# Patient Record
Sex: Female | Born: 1955
Health system: Southern US, Community
[De-identification: ages and names within clinical notes are randomized; demographics above are authoritative.]

## PROBLEM LIST (undated history)

## (undated) DIAGNOSIS — M810 Age-related osteoporosis without current pathological fracture: Secondary | ICD-10-CM

## (undated) DIAGNOSIS — N6019 Diffuse cystic mastopathy of unspecified breast: Secondary | ICD-10-CM

## (undated) DIAGNOSIS — K5790 Diverticulosis of intestine, part unspecified, without perforation or abscess without bleeding: Secondary | ICD-10-CM

## (undated) DIAGNOSIS — M8580 Other specified disorders of bone density and structure, unspecified site: Secondary | ICD-10-CM

## (undated) DIAGNOSIS — A6000 Herpesviral infection of urogenital system, unspecified: Secondary | ICD-10-CM

## (undated) HISTORY — PX: BREAST EXCISIONAL BIOPSY: SUR124

## (undated) HISTORY — PX: COLONOSCOPY: SHX174

## (undated) HISTORY — PX: OTHER SURGICAL HISTORY: SHX169

---

## 2004-04-07 ENCOUNTER — Ambulatory Visit: Payer: Self-pay | Admitting: Obstetrics and Gynecology

## 2005-04-26 ENCOUNTER — Ambulatory Visit: Payer: Self-pay | Admitting: Obstetrics and Gynecology

## 2006-05-13 ENCOUNTER — Ambulatory Visit: Payer: Self-pay | Admitting: Obstetrics and Gynecology

## 2007-05-15 ENCOUNTER — Ambulatory Visit: Payer: Self-pay | Admitting: Obstetrics and Gynecology

## 2007-05-22 ENCOUNTER — Ambulatory Visit: Payer: Self-pay | Admitting: Obstetrics and Gynecology

## 2007-06-20 ENCOUNTER — Ambulatory Visit: Payer: Self-pay | Admitting: Gastroenterology

## 2008-05-24 ENCOUNTER — Ambulatory Visit: Payer: Self-pay | Admitting: Obstetrics and Gynecology

## 2009-05-25 ENCOUNTER — Ambulatory Visit: Payer: Self-pay | Admitting: Obstetrics and Gynecology

## 2010-05-31 ENCOUNTER — Ambulatory Visit: Payer: Self-pay | Admitting: Obstetrics and Gynecology

## 2011-06-05 ENCOUNTER — Ambulatory Visit: Payer: Self-pay | Admitting: Obstetrics and Gynecology

## 2012-07-14 ENCOUNTER — Ambulatory Visit: Payer: Self-pay | Admitting: Obstetrics and Gynecology

## 2013-08-03 ENCOUNTER — Ambulatory Visit: Payer: Self-pay | Admitting: Obstetrics and Gynecology

## 2014-08-16 ENCOUNTER — Other Ambulatory Visit: Payer: Self-pay | Admitting: Obstetrics and Gynecology

## 2014-08-16 DIAGNOSIS — Z1231 Encounter for screening mammogram for malignant neoplasm of breast: Secondary | ICD-10-CM

## 2014-08-19 ENCOUNTER — Ambulatory Visit
Admission: RE | Admit: 2014-08-19 | Discharge: 2014-08-19 | Disposition: A | Discharge status: Home / Self Care | Payer: 59 | Source: Ambulatory Visit | Attending: Obstetrics and Gynecology | Admitting: Obstetrics and Gynecology

## 2014-08-19 DIAGNOSIS — Z1231 Encounter for screening mammogram for malignant neoplasm of breast: Secondary | ICD-10-CM

## 2015-08-23 ENCOUNTER — Other Ambulatory Visit: Payer: Self-pay | Admitting: Obstetrics and Gynecology

## 2015-08-23 DIAGNOSIS — Z1231 Encounter for screening mammogram for malignant neoplasm of breast: Secondary | ICD-10-CM

## 2015-09-06 ENCOUNTER — Ambulatory Visit
Admission: RE | Admit: 2015-09-06 | Discharge: 2015-09-06 | Disposition: A | Discharge status: Home / Self Care | Payer: 59 | Source: Ambulatory Visit | Attending: Obstetrics and Gynecology | Admitting: Obstetrics and Gynecology

## 2015-09-06 ENCOUNTER — Other Ambulatory Visit: Payer: Self-pay | Admitting: Obstetrics and Gynecology

## 2015-09-06 DIAGNOSIS — Z1231 Encounter for screening mammogram for malignant neoplasm of breast: Secondary | ICD-10-CM | POA: Diagnosis not present

## 2016-03-22 DIAGNOSIS — Z0000 Encounter for general adult medical examination without abnormal findings: Secondary | ICD-10-CM | POA: Diagnosis not present

## 2016-06-05 DIAGNOSIS — D229 Melanocytic nevi, unspecified: Secondary | ICD-10-CM | POA: Diagnosis not present

## 2016-06-05 DIAGNOSIS — Z1283 Encounter for screening for malignant neoplasm of skin: Secondary | ICD-10-CM | POA: Diagnosis not present

## 2016-06-05 DIAGNOSIS — L720 Epidermal cyst: Secondary | ICD-10-CM | POA: Diagnosis not present

## 2016-09-18 ENCOUNTER — Other Ambulatory Visit: Payer: Self-pay | Admitting: Obstetrics and Gynecology

## 2016-09-18 DIAGNOSIS — Z1231 Encounter for screening mammogram for malignant neoplasm of breast: Secondary | ICD-10-CM | POA: Diagnosis not present

## 2016-09-18 DIAGNOSIS — Z01419 Encounter for gynecological examination (general) (routine) without abnormal findings: Secondary | ICD-10-CM | POA: Diagnosis not present

## 2016-09-18 DIAGNOSIS — Z124 Encounter for screening for malignant neoplasm of cervix: Secondary | ICD-10-CM | POA: Diagnosis not present

## 2016-09-18 DIAGNOSIS — Z1211 Encounter for screening for malignant neoplasm of colon: Secondary | ICD-10-CM | POA: Diagnosis not present

## 2016-09-27 DIAGNOSIS — Z01419 Encounter for gynecological examination (general) (routine) without abnormal findings: Secondary | ICD-10-CM | POA: Diagnosis not present

## 2016-10-01 ENCOUNTER — Ambulatory Visit
Admission: RE | Admit: 2016-10-01 | Discharge: 2016-10-01 | Disposition: A | Discharge status: Home / Self Care | Payer: 59 | Source: Ambulatory Visit | Attending: Obstetrics and Gynecology | Admitting: Obstetrics and Gynecology

## 2016-10-01 DIAGNOSIS — Z1231 Encounter for screening mammogram for malignant neoplasm of breast: Secondary | ICD-10-CM | POA: Diagnosis present

## 2016-11-20 DIAGNOSIS — Z23 Encounter for immunization: Secondary | ICD-10-CM | POA: Diagnosis not present

## 2017-05-13 DIAGNOSIS — Z0000 Encounter for general adult medical examination without abnormal findings: Secondary | ICD-10-CM | POA: Diagnosis not present

## 2017-06-04 DIAGNOSIS — Z808 Family history of malignant neoplasm of other organs or systems: Secondary | ICD-10-CM | POA: Diagnosis not present

## 2017-06-04 DIAGNOSIS — D2239 Melanocytic nevi of other parts of face: Secondary | ICD-10-CM | POA: Diagnosis not present

## 2017-06-04 DIAGNOSIS — Z1283 Encounter for screening for malignant neoplasm of skin: Secondary | ICD-10-CM | POA: Diagnosis not present

## 2017-07-09 DIAGNOSIS — H43812 Vitreous degeneration, left eye: Secondary | ICD-10-CM | POA: Diagnosis not present

## 2017-08-01 DIAGNOSIS — H43812 Vitreous degeneration, left eye: Secondary | ICD-10-CM | POA: Diagnosis not present

## 2017-08-12 DIAGNOSIS — R310 Gross hematuria: Secondary | ICD-10-CM | POA: Diagnosis not present

## 2017-08-12 DIAGNOSIS — R300 Dysuria: Secondary | ICD-10-CM | POA: Diagnosis not present

## 2017-09-24 ENCOUNTER — Other Ambulatory Visit: Payer: Self-pay | Admitting: Obstetrics and Gynecology

## 2017-09-24 DIAGNOSIS — Z1231 Encounter for screening mammogram for malignant neoplasm of breast: Secondary | ICD-10-CM

## 2017-09-24 DIAGNOSIS — Z1211 Encounter for screening for malignant neoplasm of colon: Secondary | ICD-10-CM | POA: Diagnosis not present

## 2017-09-24 DIAGNOSIS — Z01419 Encounter for gynecological examination (general) (routine) without abnormal findings: Secondary | ICD-10-CM | POA: Diagnosis not present

## 2017-09-24 DIAGNOSIS — Z1239 Encounter for other screening for malignant neoplasm of breast: Secondary | ICD-10-CM | POA: Diagnosis not present

## 2017-09-25 DIAGNOSIS — Z8639 Personal history of other endocrine, nutritional and metabolic disease: Secondary | ICD-10-CM | POA: Diagnosis not present

## 2017-09-25 DIAGNOSIS — Z01419 Encounter for gynecological examination (general) (routine) without abnormal findings: Secondary | ICD-10-CM | POA: Diagnosis not present

## 2017-10-08 DIAGNOSIS — M810 Age-related osteoporosis without current pathological fracture: Secondary | ICD-10-CM | POA: Diagnosis not present

## 2017-10-15 ENCOUNTER — Ambulatory Visit
Admission: RE | Admit: 2017-10-15 | Discharge: 2017-10-15 | Disposition: A | Discharge status: Home / Self Care | Payer: 59 | Source: Ambulatory Visit | Attending: Obstetrics and Gynecology | Admitting: Obstetrics and Gynecology

## 2017-10-15 DIAGNOSIS — Z1231 Encounter for screening mammogram for malignant neoplasm of breast: Secondary | ICD-10-CM | POA: Diagnosis present

## 2017-10-16 DIAGNOSIS — Z1211 Encounter for screening for malignant neoplasm of colon: Secondary | ICD-10-CM | POA: Diagnosis not present

## 2017-11-05 DIAGNOSIS — Z01818 Encounter for other preprocedural examination: Secondary | ICD-10-CM | POA: Diagnosis not present

## 2017-11-05 DIAGNOSIS — Z1211 Encounter for screening for malignant neoplasm of colon: Secondary | ICD-10-CM | POA: Diagnosis not present

## 2017-11-26 DIAGNOSIS — H43812 Vitreous degeneration, left eye: Secondary | ICD-10-CM | POA: Diagnosis not present

## 2018-01-01 DIAGNOSIS — K5730 Diverticulosis of large intestine without perforation or abscess without bleeding: Secondary | ICD-10-CM | POA: Diagnosis not present

## 2018-01-01 DIAGNOSIS — K633 Ulcer of intestine: Secondary | ICD-10-CM | POA: Diagnosis not present

## 2018-01-01 DIAGNOSIS — K640 First degree hemorrhoids: Secondary | ICD-10-CM | POA: Diagnosis not present

## 2018-01-01 DIAGNOSIS — K644 Residual hemorrhoidal skin tags: Secondary | ICD-10-CM | POA: Diagnosis not present

## 2018-01-01 DIAGNOSIS — Z1211 Encounter for screening for malignant neoplasm of colon: Secondary | ICD-10-CM | POA: Diagnosis not present

## 2018-01-01 DIAGNOSIS — K6389 Other specified diseases of intestine: Secondary | ICD-10-CM | POA: Diagnosis not present

## 2018-01-30 DIAGNOSIS — K529 Noninfective gastroenteritis and colitis, unspecified: Secondary | ICD-10-CM | POA: Diagnosis not present

## 2018-04-23 ENCOUNTER — Encounter: Payer: Self-pay | Admitting: *Deleted

## 2018-04-24 ENCOUNTER — Ambulatory Visit: Payer: 59 | Admitting: Certified Registered"

## 2018-04-24 ENCOUNTER — Encounter
Admission: RE | Disposition: A | Discharge status: Home / Self Care | Payer: Self-pay | Source: Home / Self Care | Attending: Gastroenterology

## 2018-04-24 ENCOUNTER — Ambulatory Visit
Admission: RE | Admit: 2018-04-24 | Discharge: 2018-04-24 | Disposition: A | Discharge status: Home / Self Care | Payer: 59 | Attending: Gastroenterology | Admitting: Gastroenterology

## 2018-04-24 DIAGNOSIS — K5790 Diverticulosis of intestine, part unspecified, without perforation or abscess without bleeding: Secondary | ICD-10-CM | POA: Diagnosis not present

## 2018-04-24 DIAGNOSIS — Z79899 Other long term (current) drug therapy: Secondary | ICD-10-CM | POA: Insufficient documentation

## 2018-04-24 DIAGNOSIS — M8580 Other specified disorders of bone density and structure, unspecified site: Secondary | ICD-10-CM | POA: Diagnosis not present

## 2018-04-24 DIAGNOSIS — K5732 Diverticulitis of large intestine without perforation or abscess without bleeding: Secondary | ICD-10-CM | POA: Diagnosis present

## 2018-04-24 DIAGNOSIS — K5730 Diverticulosis of large intestine without perforation or abscess without bleeding: Secondary | ICD-10-CM | POA: Insufficient documentation

## 2018-04-24 DIAGNOSIS — K633 Ulcer of intestine: Secondary | ICD-10-CM | POA: Diagnosis not present

## 2018-04-24 HISTORY — DX: Herpesviral infection of urogenital system, unspecified: A60.00

## 2018-04-24 HISTORY — PX: FLEXIBLE SIGMOIDOSCOPY: SHX5431

## 2018-04-24 HISTORY — DX: Other specified disorders of bone density and structure, unspecified site: M85.80

## 2018-04-24 SURGERY — SIGMOIDOSCOPY, FLEXIBLE
Anesthesia: General

## 2018-04-24 MED ORDER — PROPOFOL 500 MG/50ML IV EMUL
INTRAVENOUS | Status: DC | PRN
  Administered 2018-04-24: 100 ug/kg/min via INTRAVENOUS

## 2018-04-24 MED ORDER — PROPOFOL 10 MG/ML IV BOLUS
INTRAVENOUS | Status: DC | PRN
  Administered 2018-04-24: 70 mg via INTRAVENOUS

## 2018-04-24 MED ORDER — PROPOFOL 10 MG/ML IV BOLUS
INTRAVENOUS | Status: AC
  Filled 2018-04-24: qty 40

## 2018-04-24 MED ORDER — SODIUM CHLORIDE 0.9 % IV SOLN
INTRAVENOUS | Status: DC
  Administered 2018-04-24: 1000 mL via INTRAVENOUS

## 2018-04-24 MED ORDER — LIDOCAINE HCL (CARDIAC) PF 100 MG/5ML IV SOSY
PREFILLED_SYRINGE | INTRAVENOUS | Status: DC | PRN
  Administered 2018-04-24: 70 mg via INTRAVENOUS

## 2018-04-24 NOTE — Anesthesia Preprocedure Evaluation (Signed)
Anesthesia Evaluation  Patient identified by MRN, date of birth, ID band Patient awake    Reviewed: Allergy & Precautions, NPO status , Patient's Chart, lab work & pertinent test results  History of Anesthesia Complications Negative for: history of anesthetic complications  Airway Mallampati: II  TM Distance: >3 FB Neck ROM: Full    Dental no notable dental hx.    Pulmonary neg pulmonary ROS, neg sleep apnea, neg COPD,    breath sounds clear to auscultation- rhonchi (-) wheezing      Cardiovascular Exercise Tolerance: Good (-) hypertension(-) CAD and (-) Past MI  Rhythm:Regular Rate:Normal - Systolic murmurs and - Diastolic murmurs    Neuro/Psych negative neurological ROS  negative psych ROS   GI/Hepatic negative GI ROS, Neg liver ROS,   Endo/Other  negative endocrine ROSneg diabetes  Renal/GU negative Renal ROS     Musculoskeletal negative musculoskeletal ROS (+)   Abdominal (+) - obese,   Peds  Hematology negative hematology ROS (+)   Anesthesia Other Findings Past Medical History: No date: Herpes genitalia No date: Osteopenia   Reproductive/Obstetrics                             Anesthesia Physical Anesthesia Plan  ASA: II  Anesthesia Plan: General   Post-op Pain Management:    Induction: Intravenous  PONV Risk Score and Plan: 2 and Propofol infusion  Airway Management Planned: Natural Airway  Additional Equipment:   Intra-op Plan:   Post-operative Plan:   Informed Consent: I have reviewed the patients History and Physical, chart, labs and discussed the procedure including the risks, benefits and alternatives for the proposed anesthesia with the patient or authorized representative who has indicated his/her understanding and acceptance.     Dental advisory given  Plan Discussed with: CRNA and Anesthesiologist  Anesthesia Plan Comments:         Anesthesia  Quick Evaluation

## 2018-04-24 NOTE — Anesthesia Post-op Follow-up Note (Signed)
Anesthesia QCDR form completed.        

## 2018-04-24 NOTE — H&P (Addendum)
Outpatient short stay form Pre-procedure 04/24/2018 12:51 PM Lollie Sails MD  Primary Physician: Dr. [place] Pink  Reason for visit: Sedated flexible sigmoidoscopy  History of present illness: Patient is a 63 year old female presenting today for flexible sigmoidoscopy.  She had a procedure on 01/01/2018 which at that time showed an area about 17 cm proximal to the anal verge was edematous and erythematous with some mild ulceration and erosion.  This was approximately the 2 cm segment 360 degrees.  Biopsy showed colonic mucosa with ulceration and acute and chronic inflammation negative for dysplasia.  Comment was "the changes can be seen in diverticular disease associated colitis, chronic infectious process,, chronic idiopathic inflammatory bowel disease and other etiologies.  She has had essentially minimal symptoms and no changes in bowel habits.  When I saw her in the clinic in follow-up on December 30 we discussed this at length.  Had a "full-blown" episode of diverticulitis.  That time I placed her on a single dose of Citrucel daily intent of rechecking the site and repeating biopsies if necessary.  She has continued to be asymptomatic.  Is also of note patient takes no aspirin products or blood thinning agents or NSAIDs.    Current Facility-Administered Medications:  .  0.9 %  sodium chloride infusion, , Intravenous, Continuous, Lollie Sails, MD, Last Rate: 20 mL/hr at 04/24/18 1232  Medications Prior to Admission  Medication Sig Dispense Refill Last Dose  . BIOTIN PO Take 1 mg by mouth 3 (three) times daily.   Past Week at Unknown time  . Calcium Carbonate-Vitamin D (CALCIUM 500/D PO) Take 1 capsule by mouth 2 (two) times daily.   Past Week at Unknown time  . CHOLECALCIFEROL PO Take 1 tablet by mouth daily.   Past Week at Unknown time  . folic acid (FOLVITE) 1 MG tablet Take 1 mg by mouth daily.   Past Week at Unknown time  . valACYclovir (VALTREX) 500 MG tablet Take 500 mg by  mouth 2 (two) times daily.   Past Week at Unknown time     No Known Allergies   Past Medical History:  Diagnosis Date  . Herpes genitalia   . Osteopenia     Review of systems:      Physical Exam    Heart and lungs: Regular rate and rhythm without rub or gallop, lungs are bilaterally clear.    HEENT: Normocephalic atraumatic eyes are anicteric    Other:    Pertinant exam for procedure: Soft nontender nondistended bowel sounds positive normoactive.    Planned proceedures: Sedated flexible sigmoidoscopy and indicated procedures.    Lollie Sails, MD Gastroenterology 04/24/2018  12:51 PM

## 2018-04-24 NOTE — Transfer of Care (Signed)
Immediate Anesthesia Transfer of Care Note  Patient: Albena Comes Sabel  Procedure(s) Performed: FLEXIBLE SIGMOIDOSCOPY (N/A )  Patient Location: PACU  Anesthesia Type:General  Level of Consciousness: awake, alert  and oriented  Airway & Oxygen Therapy: Patient Spontanous Breathing  Post-op Assessment: Report given to RN and Post -op Vital signs reviewed and stable  Post vital signs: Reviewed and stable  Last Vitals:  Vitals Value Taken Time  BP    Temp    Pulse 88 04/24/2018  1:16 PM  Resp    SpO2 100 % 04/24/2018  1:16 PM  Vitals shown include unvalidated device data.  Last Pain:  Vitals:   04/24/18 1145  TempSrc: Tympanic  PainSc: 0-No pain         Complications: No apparent anesthesia complications

## 2018-04-24 NOTE — Anesthesia Postprocedure Evaluation (Signed)
Anesthesia Post Note  Patient: [name]  Procedure(s) Performed: FLEXIBLE SIGMOIDOSCOPY (N/A )  Patient location during evaluation: Endoscopy Anesthesia Type: General Level of consciousness: awake and alert and oriented Pain management: pain level controlled Vital Signs Assessment: post-procedure vital signs reviewed and stable Respiratory status: spontaneous breathing, nonlabored ventilation and respiratory function stable Cardiovascular status: blood pressure returned to baseline and stable Postop Assessment: no signs of nausea or vomiting Anesthetic complications: no     Last Vitals:  Vitals:   04/24/18 1326 04/24/18 1346  BP: 119/71 120/67  Pulse: 80   Resp:    Temp:    SpO2: 100%     Last Pain:  Vitals:   04/24/18 1346  TempSrc:   PainSc: 0-No pain                 [name]

## 2018-04-24 NOTE — Op Note (Signed)
[facility] Gastroenterology Patient Name: Penny Monroe Procedure Date: 04/24/2018 12:51 PM MRN: [id number] Account #: [id number] Date of Birth: Nov 24, 1955 Admit Type: Outpatient Age: 63 Room: [facility] ENDO ROOM 1 Gender: Female Note Status: Finalized Procedure:            Flexible Sigmoidoscopy Indications:          Follow-up of diverticulitis Providers:            Lollie Sails, MD Referring MD:         Youlanda Roys. Lovie Macadamia, MD (Referring MD) Medicines:            Monitored Anesthesia Care Complications:        No immediate complications. Procedure:            Pre-Anesthesia Assessment:                       - ASA Grade Assessment: II - A patient with mild                        systemic disease.                       After obtaining informed consent, the scope was passed                        under direct vision. The Colonoscope was introduced                        through the anus and advanced to the 40 cm from the                        anal verge. The flexible sigmoidoscopy was accomplished                        without difficulty. The patient tolerated the procedure                        well. The quality of the bowel preparation was good. Findings:      The scope was advanced without difficulty to 40 cm from the anal       verge.Multiple small to medium-mouthed diverticula were found in the       sigmoid colon. The area of the inflammation previously noted showed a       few small petechiae, but no inflammation, ulceration, edema, erosion or       erythema otherwise.      The exam was otherwise without abnormality. Retroflexion in the rectal       vault was normal. Impression:           - Diverticulosis in the sigmoid colon.                       - The examination was otherwise normal.                       - No specimens collected. Recommendation:       - Use Citrucel one tablespoon PO daily daily. Procedure Code(s):    --- Professional ---                  [phone], Sigmoidoscopy, flexible; diagnostic, including  collection of specimen(s) by brushing or washing, when                        performed (separate procedure) Diagnosis Code(s):    --- Professional ---                       O03.55, Diverticulitis of large intestine without                        perforation or abscess without bleeding                       K57.30, Diverticulosis of large intestine without                        perforation or abscess without bleeding CPT copyright 2018 American Medical Association. All rights reserved. The codes documented in this report are preliminary and upon coder review may  be revised to meet current compliance requirements. Lollie Sails, MD 04/24/2018 1:21:16 PM This report has been signed electronically. Number of Addenda: 0 Note Initiated On: 04/24/2018 12:51 PM Total Procedure Duration: 0 hours 8 minutes 17 seconds       [facility]

## 2018-04-25 ENCOUNTER — Encounter: Payer: Self-pay | Admitting: Gastroenterology

## 2018-05-09 IMAGING — MG MM DIGITAL SCREENING BILAT W/ TOMO W/ CAD
9 of 13 series · 9 of 29 positions shown · non-contrast
Comparison: Previous exam(s).

CLINICAL DATA: Screening.

EXAM:
2D DIGITAL SCREENING BILATERAL MAMMOGRAM WITH CAD AND ADJUNCT TOMO

[R MLO (1 of 2)]
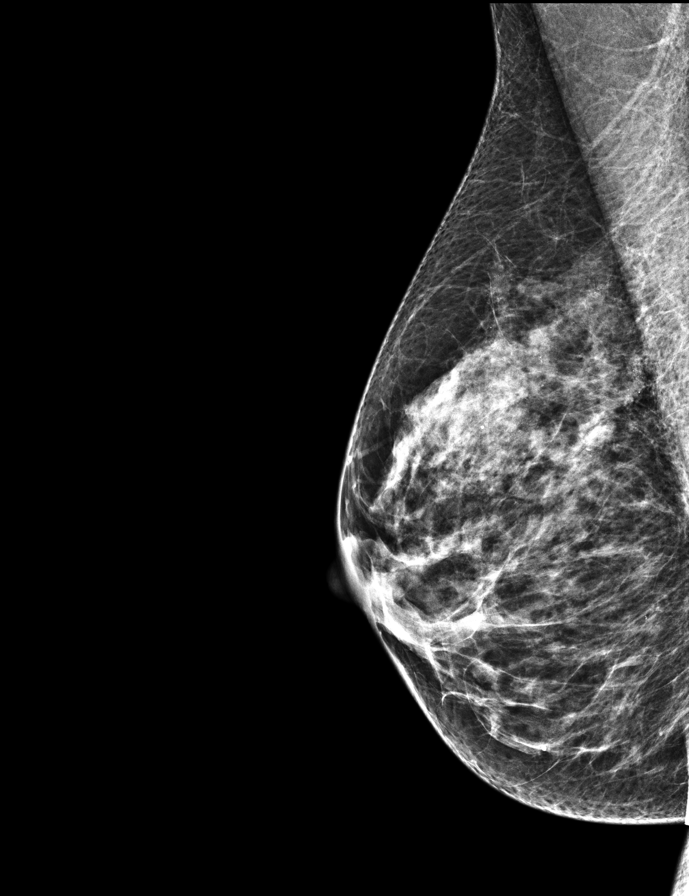

[R CC]
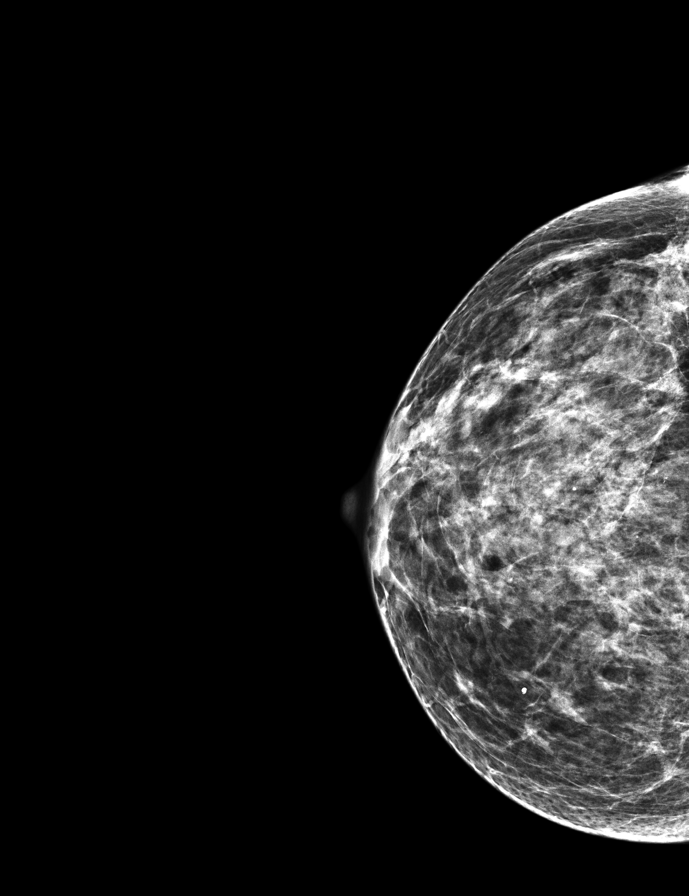

[L MLO synth-2D]
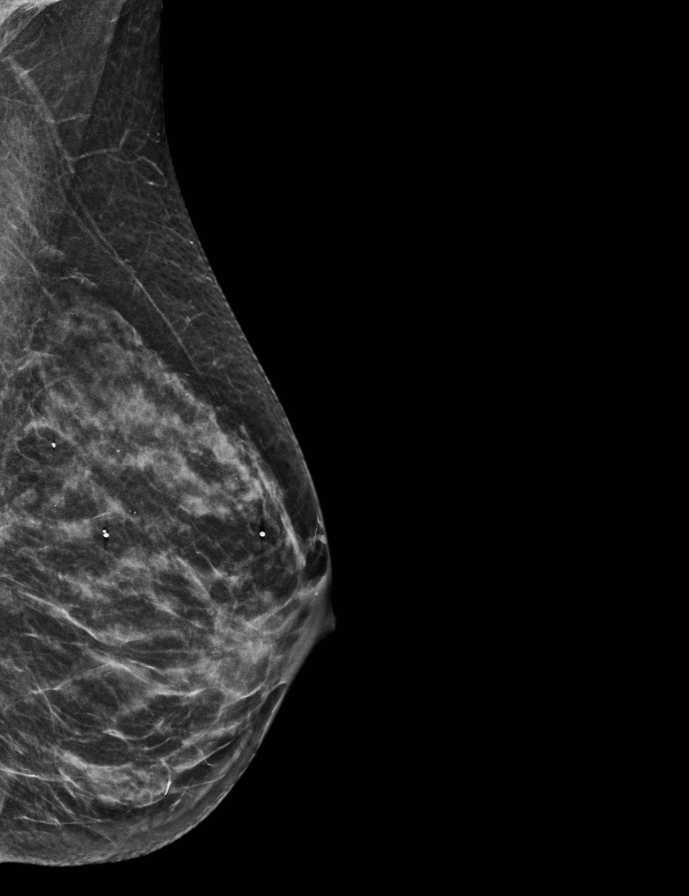

[L CC]
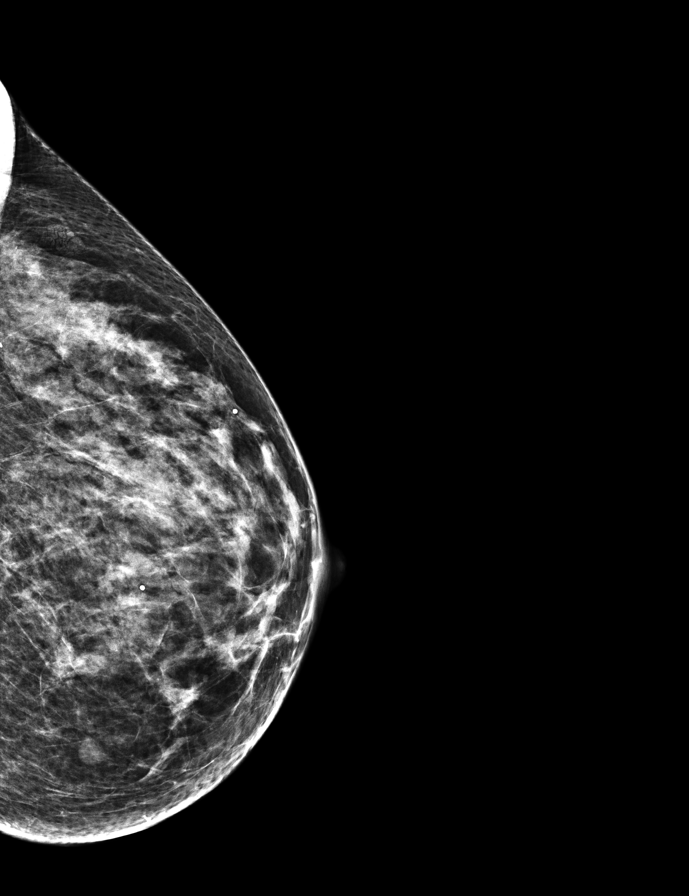

[R MLO (2 of 2)]
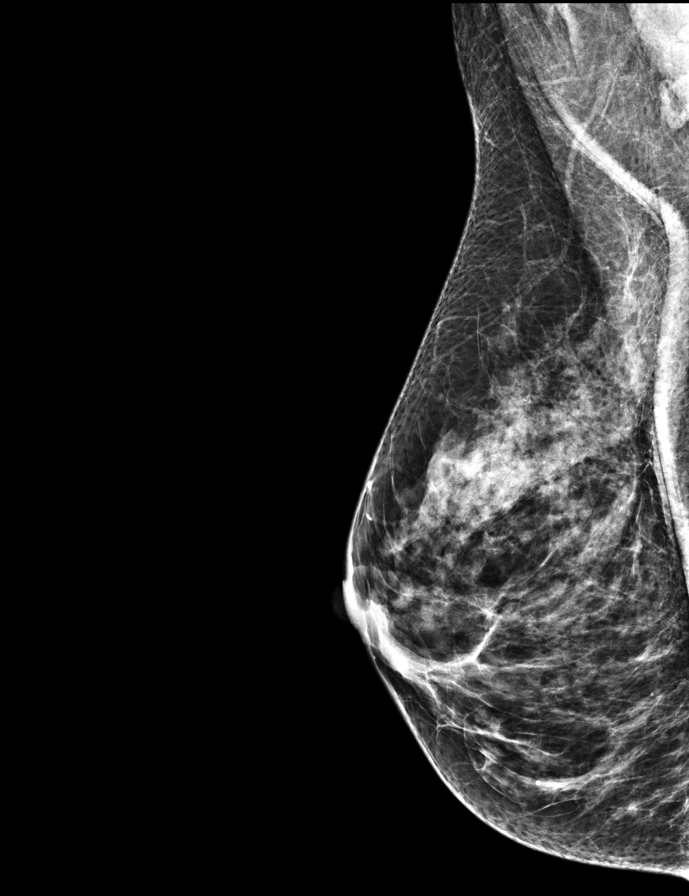

[L CC synth-2D]
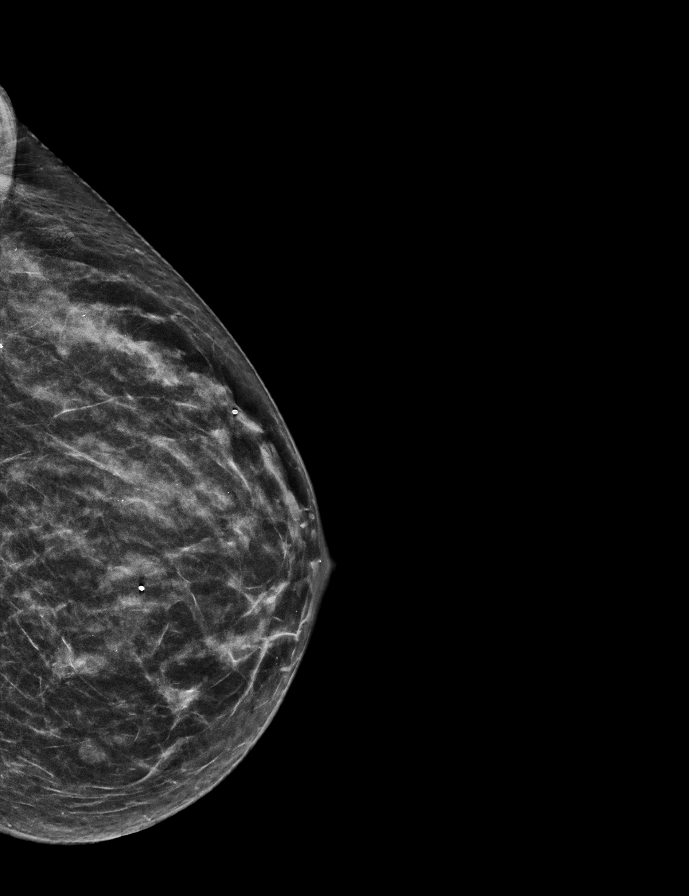

[R CC synth-2D]
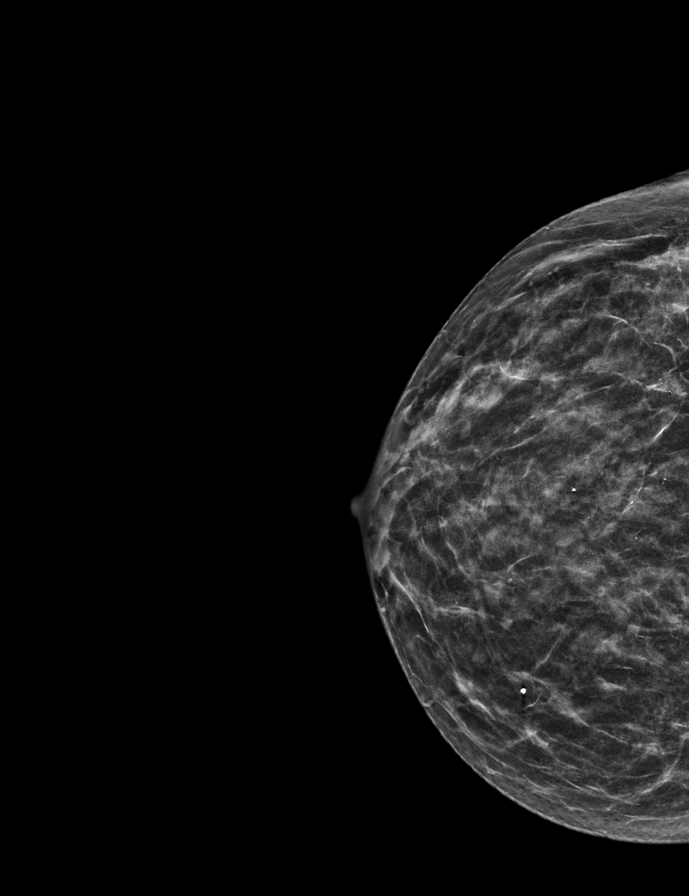

[R MLO synth-2D]
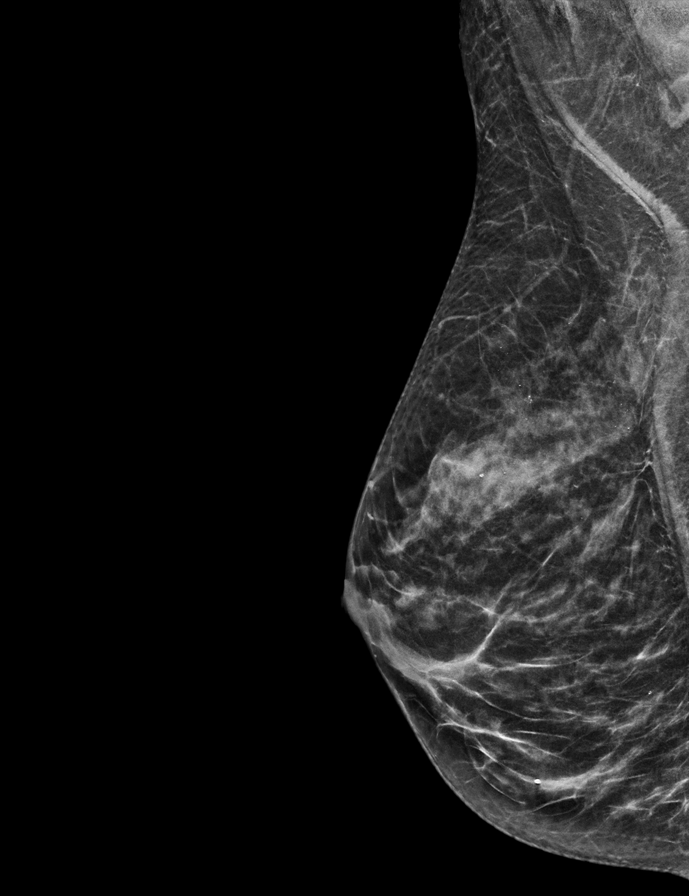

[L MLO]
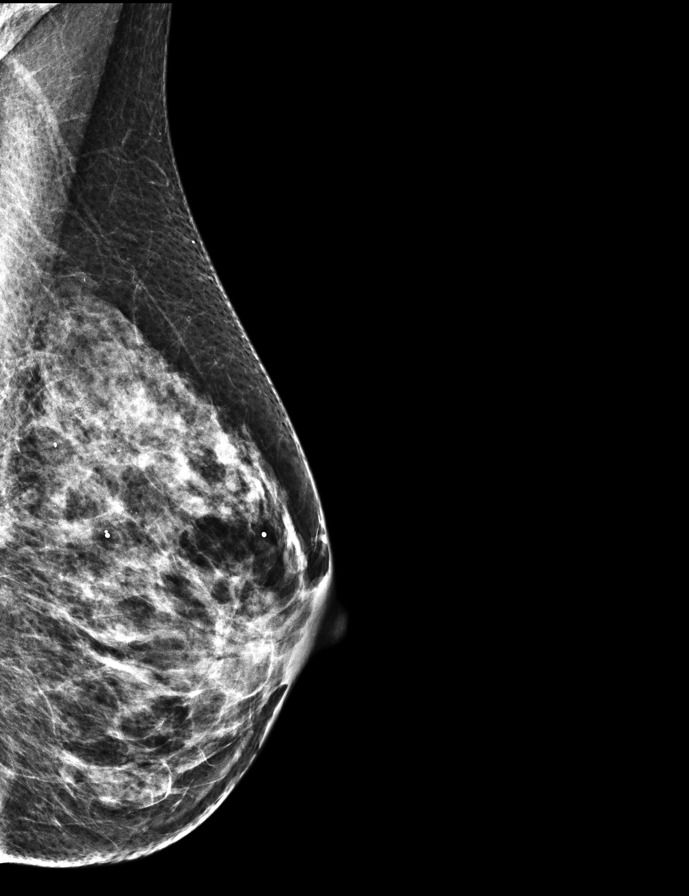

[9 of 29 positions shown; findings below may reference images not displayed]

ACR Breast Density Category c: The breast tissue is heterogeneously
dense, which may obscure small masses.
FINDINGS: There are no findings suspicious for malignancy. Images were
processed with CAD.
IMPRESSION: No mammographic evidence of malignancy. A result letter of this
screening mammogram will be mailed directly to the patient.

RECOMMENDATION:
Screening mammogram in one year. (Code:TN-0-K4T)

BI-RADS CATEGORY  1: Negative.

## 2018-09-29 ENCOUNTER — Other Ambulatory Visit: Payer: Self-pay | Admitting: Family Medicine

## 2018-09-29 DIAGNOSIS — Z1231 Encounter for screening mammogram for malignant neoplasm of breast: Secondary | ICD-10-CM

## 2018-10-30 ENCOUNTER — Ambulatory Visit
Admission: RE | Admit: 2018-10-30 | Discharge: 2018-10-30 | Disposition: A | Discharge status: Home / Self Care | Payer: 59 | Source: Ambulatory Visit | Attending: Family Medicine | Admitting: Family Medicine

## 2018-10-30 DIAGNOSIS — Z1231 Encounter for screening mammogram for malignant neoplasm of breast: Secondary | ICD-10-CM | POA: Diagnosis not present

## 2018-11-06 ENCOUNTER — Other Ambulatory Visit: Payer: Self-pay | Admitting: Family Medicine

## 2018-11-06 DIAGNOSIS — R928 Other abnormal and inconclusive findings on diagnostic imaging of breast: Secondary | ICD-10-CM

## 2018-11-13 ENCOUNTER — Ambulatory Visit
Admission: RE | Admit: 2018-11-13 | Discharge: 2018-11-13 | Disposition: A | Discharge status: Home / Self Care | Payer: 59 | Source: Ambulatory Visit | Attending: Family Medicine | Admitting: Family Medicine

## 2018-11-13 DIAGNOSIS — R928 Other abnormal and inconclusive findings on diagnostic imaging of breast: Secondary | ICD-10-CM | POA: Diagnosis not present

## 2019-08-18 ENCOUNTER — Other Ambulatory Visit: Payer: Self-pay

## 2019-08-18 ENCOUNTER — Ambulatory Visit: Payer: 59 | Admitting: Dermatology

## 2019-08-18 DIAGNOSIS — D225 Melanocytic nevi of trunk: Secondary | ICD-10-CM

## 2019-08-18 DIAGNOSIS — L578 Other skin changes due to chronic exposure to nonionizing radiation: Secondary | ICD-10-CM

## 2019-08-18 DIAGNOSIS — L720 Epidermal cyst: Secondary | ICD-10-CM | POA: Diagnosis not present

## 2019-08-18 DIAGNOSIS — Q825 Congenital non-neoplastic nevus: Secondary | ICD-10-CM

## 2019-08-18 DIAGNOSIS — D229 Melanocytic nevi, unspecified: Secondary | ICD-10-CM

## 2019-08-18 DIAGNOSIS — D2262 Melanocytic nevi of left upper limb, including shoulder: Secondary | ICD-10-CM

## 2019-08-18 DIAGNOSIS — Z1283 Encounter for screening for malignant neoplasm of skin: Secondary | ICD-10-CM

## 2019-08-18 DIAGNOSIS — L814 Other melanin hyperpigmentation: Secondary | ICD-10-CM

## 2019-08-18 DIAGNOSIS — L918 Other hypertrophic disorders of the skin: Secondary | ICD-10-CM

## 2019-08-18 DIAGNOSIS — L738 Other specified follicular disorders: Secondary | ICD-10-CM

## 2019-08-18 DIAGNOSIS — L821 Other seborrheic keratosis: Secondary | ICD-10-CM

## 2019-08-18 DIAGNOSIS — D1801 Hemangioma of skin and subcutaneous tissue: Secondary | ICD-10-CM

## 2019-08-18 NOTE — Progress Notes (Signed)
   Follow-Up Visit   Subjective  Penny Monroe is a 64 y.o. female who presents for the following: Annual Exam.  Patient here today for TBSE. No history of skin cancer and nothing new or changing spots that patient is aware of.   The following portions of the chart were reviewed this encounter and updated as appropriate:      Review of Systems:  No other skin or systemic complaints except as noted in HPI or Assessment and Plan.  Objective  Well appearing patient in no apparent distress; mood and affect are within normal limits.  A full examination was performed including scalp, head, eyes, ears, nose, lips, neck, chest, axillae, abdomen, back, buttocks, bilateral upper extremities, bilateral lower extremities, hands, feet, fingers, toes, fingernails, and toenails. All findings within normal limits unless otherwise noted below.  Objective  Left Forearm, Left Inframammary: 3.84mm brown macule  Objective  Mid sternum: Small firm blue papule with punctum  Left lateral Thigh -; Left calf  Objective  Left lateral Thigh -: Speckled light brown papule 1.0cm, no changes  Left calf: 1.0cm brown plaque, no changes  Objective  Forehead: Yellow papules   Assessment & Plan  Nevus (2) Left Forearm; Left Inframammary  Benign-appearing.  Observation.  Call clinic for new or changing moles.  Recommend daily use of broad spectrum spf 30+ sunscreen to sun-exposed areas.    Epidermal inclusion cyst Mid sternum  Benign, observe.  Patient declines extraction today.   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  Stable. Observation.  Call clinic for new or changing moles.  Recommend daily use of broad spectrum spf 30+ sunscreen to sun-exposed areas.    Sebaceous hyperplasia Forehead  Benign, observe.     Lentigines - Scattered tan macules back, face - Discussed due to sun exposure - Benign, observe - Call for any  changes  Seborrheic Keratoses - Stuck-on, waxy, tan-brown papules and plaques back, face - Discussed benign etiology and prognosis. - Observe - Call for any changes  Melanocytic Nevi - Tan-brown and/or pink-flesh-colored symmetric macules and papules back, arms - Benign appearing on exam today - Observation - Call clinic for new or changing moles - Recommend daily use of broad spectrum spf 30+ sunscreen to sun-exposed areas.   Hemangiomas - Red papules - Discussed benign nature - Observe - Call for any changes  Actinic Damage - diffuse scaly erythematous macules with underlying dyspigmentation chest, shoulders - Recommend daily broad spectrum sunscreen SPF 30+ to sun-exposed areas, reapply every 2 hours as needed.  - Call for new or changing lesions.  Skin cancer screening performed today.  Acrochordons (Skin Tags) - Fleshy, skin-colored pedunculated papules neck - Benign appearing.  - Observe. - If desired, they can be removed with an in office procedure that is not covered by insurance. - Please call the clinic if you notice any new or changing lesions.   Return in about 1 year (around 08/17/2020) for TBSE.  Graciella Belton, RMA, am acting as scribe for Brendolyn Patty, MD .  Documentation: I have reviewed the above documentation for accuracy and completeness, and I agree with the above.  Brendolyn Patty MD

## 2019-08-18 NOTE — Patient Instructions (Addendum)
Recommend daily broad spectrum sunscreen SPF 30+ to sun-exposed areas, reapply every 2 hours as needed. Call for new or changing lesions.  Melanoma ABCDEs  Melanoma is the most dangerous type of skin cancer, and is the leading cause of death from skin disease.  You are more likely to develop melanoma if you:  Have light-colored skin, light-colored eyes, or red or blond hair  Spend a lot of time in the sun  Tan regularly, either outdoors or in a tanning bed  Have had blistering sunburns, especially during childhood  Have a close family member who has had a melanoma  Have atypical moles or large birthmarks  Early detection of melanoma is key since treatment is typically straightforward and cure rates are extremely high if we catch it early.   The first sign of melanoma is often a change in a mole or a new dark spot.  The ABCDE system is a way of remembering the signs of melanoma.  A for asymmetry:  The two halves do not match. B for border:  The edges of the growth are irregular. C for color:  A mixture of colors are present instead of an even brown color. D for diameter:  Melanomas are usually (but not always) greater than 37mm - the size of a pencil eraser. E for evolution:  The spot keeps changing in size, shape, and color.  Please check your skin once per month between visits. You can use a small mirror in front and a large mirror behind you to keep an eye on the back side or your body.   If you see any new or changing lesions before your next follow-up, please call to schedule a visit.  Please continue daily skin protection including broad spectrum sunscreen SPF 30+ to sun-exposed areas, reapplying every 2 hours as needed when you're outdoors.   Gentle Skin Care Guide  1. Bathe no more than once a day.  2. Avoid bathing in hot water  3. Use a mild soap like Dove, Vanicream, Cetaphil, CeraVe. Can use Lever 2000 or Cetaphil antibacterial soap  4. Use soap only where you need  it. On most days, use it under your arms, between your legs, and on your feet. Let the water rinse other areas unless visibly dirty.  5. When you get out of the bath/shower, use a towel to gently blot your skin dry, don't rub it.  6. While your skin is still a little damp, apply a moisturizing cream such as Vanicream, CeraVe, Cetaphil, Eucerin, Sarna lotion or plain Vaseline Jelly. For hands apply Neutrogena [place] Hand Cream or Excipial Hand Cream.  7. Reapply moisturizer any time you start to itch or feel dry.  8. Sometimes using free and clear laundry detergents can be helpful. Fabric softener sheets should be avoided. Downy Free & Gentle liquid, or any liquid fabric softener that is free of dyes and perfumes, it acceptable to use  9. If your doctor has given you prescription creams you may apply moisturizers over them

## 2019-09-30 ENCOUNTER — Other Ambulatory Visit: Payer: Self-pay | Admitting: Obstetrics and Gynecology

## 2019-09-30 DIAGNOSIS — Z1231 Encounter for screening mammogram for malignant neoplasm of breast: Secondary | ICD-10-CM

## 2019-11-24 ENCOUNTER — Ambulatory Visit
Admission: RE | Admit: 2019-11-24 | Discharge: 2019-11-24 | Disposition: A | Discharge status: Home / Self Care | Payer: BC Managed Care – PPO | Source: Ambulatory Visit | Attending: Obstetrics and Gynecology | Admitting: Obstetrics and Gynecology

## 2019-11-24 ENCOUNTER — Other Ambulatory Visit: Payer: Self-pay

## 2019-11-24 DIAGNOSIS — Z1231 Encounter for screening mammogram for malignant neoplasm of breast: Secondary | ICD-10-CM | POA: Diagnosis not present

## 2020-06-06 ENCOUNTER — Other Ambulatory Visit: Payer: Self-pay

## 2020-06-06 ENCOUNTER — Ambulatory Visit: Payer: BC Managed Care – PPO | Attending: Internal Medicine

## 2020-06-06 DIAGNOSIS — Z23 Encounter for immunization: Secondary | ICD-10-CM

## 2020-06-06 MED ORDER — MODERNA COVID-19 VACCINE 100 MCG/0.5ML IM SUSP
INTRAMUSCULAR | 0 refills | Status: AC
  Filled 2020-06-06: qty 0.25, 20d supply, fill #0

## 2020-06-06 NOTE — Progress Notes (Signed)
   Covid-19 Vaccination Clinic  Name:  Penny Monroe    MRN: [id number] DOB: 1955-10-14  06/06/2020  [name] was observed post Covid-19 immunization for 15 minutes without incident. She was provided with Vaccine Information Sheet and instruction to access the V-Safe system.   [name] was instructed to call 911 with any severe reactions post vaccine: Marland Kitchen Difficulty breathing  . Swelling of face and throat  . A fast heartbeat  . A bad rash all over body  . Dizziness and weakness   Immunizations Administered    Name Date Dose VIS Date Route   Moderna Covid-19 Booster Vaccine 06/06/2020 10:19 AM 0.25 mL 12/16/2019 Intramuscular   Manufacturer: Levan Hurst   Lot: [id number]   [place]: [phone]

## 2020-08-22 ENCOUNTER — Encounter: Payer: BC Managed Care – PPO | Admitting: Dermatology

## 2020-12-13 ENCOUNTER — Other Ambulatory Visit: Payer: Self-pay | Admitting: Obstetrics and Gynecology

## 2020-12-13 DIAGNOSIS — Z1231 Encounter for screening mammogram for malignant neoplasm of breast: Secondary | ICD-10-CM

## 2020-12-29 ENCOUNTER — Other Ambulatory Visit: Payer: Self-pay

## 2020-12-29 ENCOUNTER — Ambulatory Visit
Admission: RE | Admit: 2020-12-29 | Discharge: 2020-12-29 | Disposition: A | Discharge status: Home / Self Care | Payer: BC Managed Care – PPO | Source: Ambulatory Visit | Attending: Obstetrics and Gynecology | Admitting: Obstetrics and Gynecology

## 2020-12-29 DIAGNOSIS — Z1231 Encounter for screening mammogram for malignant neoplasm of breast: Secondary | ICD-10-CM | POA: Diagnosis not present

## 2021-01-24 ENCOUNTER — Other Ambulatory Visit: Payer: Self-pay

## 2021-01-24 ENCOUNTER — Other Ambulatory Visit: Payer: Self-pay | Admitting: Dermatology

## 2021-01-24 ENCOUNTER — Ambulatory Visit: Payer: BC Managed Care – PPO | Admitting: Dermatology

## 2021-01-24 DIAGNOSIS — D2271 Melanocytic nevi of right lower limb, including hip: Secondary | ICD-10-CM | POA: Diagnosis not present

## 2021-01-24 DIAGNOSIS — D485 Neoplasm of uncertain behavior of skin: Secondary | ICD-10-CM

## 2021-01-24 DIAGNOSIS — D229 Melanocytic nevi, unspecified: Secondary | ICD-10-CM

## 2021-01-24 DIAGNOSIS — I781 Nevus, non-neoplastic: Secondary | ICD-10-CM

## 2021-01-24 DIAGNOSIS — D2262 Melanocytic nevi of left upper limb, including shoulder: Secondary | ICD-10-CM

## 2021-01-24 DIAGNOSIS — Q825 Congenital non-neoplastic nevus: Secondary | ICD-10-CM | POA: Diagnosis not present

## 2021-01-24 DIAGNOSIS — D1801 Hemangioma of skin and subcutaneous tissue: Secondary | ICD-10-CM

## 2021-01-24 DIAGNOSIS — L814 Other melanin hyperpigmentation: Secondary | ICD-10-CM

## 2021-01-24 DIAGNOSIS — L578 Other skin changes due to chronic exposure to nonionizing radiation: Secondary | ICD-10-CM

## 2021-01-24 DIAGNOSIS — L738 Other specified follicular disorders: Secondary | ICD-10-CM

## 2021-01-24 DIAGNOSIS — D239 Other benign neoplasm of skin, unspecified: Secondary | ICD-10-CM

## 2021-01-24 DIAGNOSIS — D171 Benign lipomatous neoplasm of skin and subcutaneous tissue of trunk: Secondary | ICD-10-CM

## 2021-01-24 DIAGNOSIS — Z1283 Encounter for screening for malignant neoplasm of skin: Secondary | ICD-10-CM | POA: Diagnosis not present

## 2021-01-24 DIAGNOSIS — L720 Epidermal cyst: Secondary | ICD-10-CM

## 2021-01-24 HISTORY — DX: Other benign neoplasm of skin, unspecified: D23.9

## 2021-01-24 NOTE — Progress Notes (Signed)
Follow-Up Visit   Subjective  Penny Monroe is a 65 y.o. female who presents for the following: Annual Exam.  Patient here for TBSE. She has a couple of bumps on her posterior neck and left abdomen she would like checked. She has an itchy spot on her left upper arm.   The following portions of the chart were reviewed this encounter and updated as appropriate:       Review of Systems:  No other skin or systemic complaints except as noted in HPI or Assessment and Plan.  Objective  Well appearing patient in no apparent distress; mood and affect are within normal limits.  A full examination was performed including scalp, head, eyes, ears, nose, lips, neck, chest, axillae, abdomen, back, buttocks, bilateral upper extremities, bilateral lower extremities, hands, feet, fingers, toes, fingernails, and toenails. All findings within normal limits unless otherwise noted below.  L forearm, L inframammary 3.50mm brown macules of the left forearm and left inframammary  L post upper arm 68mm light pink flesh papule   L lateral thigh; L calf Left lateral Thigh -: Speckled light brown papule 1.0cm, no changes   Left calf: 1.0cm brown papule, no changes    R post neck 1.0cm firm sq nodule  Left Flank Rubbery nodule, 1.5cm  Right Medial Heel 4.0 x 2.59mm two-tone med dark brown macule        Assessment & Plan  Skin cancer screening performed today.  Actinic Damage - chronic, secondary to cumulative UV radiation exposure/sun exposure over time - diffuse scaly erythematous macules with underlying dyspigmentation - Recommend daily broad spectrum sunscreen SPF 30+ to sun-exposed areas, reapply every 2 hours as needed.  - Recommend staying in the shade or wearing long sleeves, sun glasses (UVA+UVB protection) and wide brim hats (4-inch brim around the entire circumference of the hat). - Call for new or changing lesions.  Lentigines - Scattered tan macules - Due to sun exposure -  Benign-appering, observe - Recommend daily broad spectrum sunscreen SPF 30+ to sun-exposed areas, reapply every 2 hours as needed. - Call for any changes  Melanocytic Nevi - Tan-brown and/or pink-flesh-colored symmetric macules and papules - Benign appearing on exam today - Observation - Call clinic for new or changing moles - Recommend daily use of broad spectrum spf 30+ sunscreen to sun-exposed areas.   Hemangiomas - Red papules - Discussed benign nature - Observe - Call for any changes  Sebaceous Hyperplasia - Small yellow papules with a central dell of the forehead - Benign - Observe   Nevus (2) L forearm, L inframammary; L post upper arm  Benign-appearing.  Stable. Observation.  Call clinic for new or changing moles.  Recommend daily use of broad spectrum spf 30+ sunscreen to sun-exposed areas.   Irritated Nevus vs Irritated SK L post upper arm.  Impoyz Cream qd/bid prn itch, sample given.   Congenital non-neoplastic nevus L lateral thigh; L calf  Benign-appearing.  Stable. Observation.  Call clinic for new or changing moles.  Recommend daily use of broad spectrum spf 30+ sunscreen to sun-exposed areas.    Epidermoid cyst R post neck  Benign-appearing. Exam most consistent with an epidermal inclusion cyst. Discussed that a cyst is a benign growth that can grow over time and sometimes get irritated or inflamed. Recommend observation if it is not bothersome. Discussed option of surgical excision to remove it if it is growing, symptomatic, or other changes noted. Please call for new or changing lesions so they can be  evaluated.  Since not bothersome, will observe    Lipoma of torso Left Flank  Benign-appearing, observe for changes.    Neoplasm of uncertain behavior of skin Right Medial Heel  Epidermal / dermal shaving  Lesion diameter (cm):  0.6 Informed consent: discussed and consent obtained   Patient was prepped and draped in usual sterile fashion: Area  prepped with alcohol. Anesthesia: the lesion was anesthetized in a standard fashion   Anesthetic:  1% lidocaine w/ epinephrine 1-100,000 buffered w/ 8.4% NaHCO3 Instrument used: flexible razor blade   Hemostasis achieved with: pressure, aluminum chloride and electrodesiccation   Outcome: patient tolerated procedure well   Post-procedure details: wound care instructions given   Post-procedure details comment:  Ointment and small bandage applied  Specimen 1 - Surgical pathology Differential Diagnosis: Nevus r/o Dysplasia Check Margins: Yes 4.0 x 2.12mm two-tone med dark brown macule  New within the past year.   Return in about 1 year (around 01/24/2022) for TBSE.  Documentation: I have reviewed the above documentation for accuracy and completeness, and I agree with the above.  Brendolyn Patty MD

## 2021-01-24 NOTE — Patient Instructions (Addendum)
Curad Mediplast - Apply to callous on foot, reapply as needed until improved.    Wound Care Instructions  Cleanse wound gently with soap and water once a day then pat dry with clean gauze. Apply a thing coat of Petrolatum (petroleum jelly, "Vaseline") over the wound (unless you have an allergy to this). We recommend that you use a new, sterile tube of Vaseline. Do not pick or remove scabs. Do not remove the yellow or white "healing tissue" from the base of the wound.  Cover the wound with fresh, clean, nonstick gauze and secure with paper tape. You may use Band-Aids in place of gauze and tape if the would is small enough, but would recommend trimming much of the tape off as there is often too much. Sometimes Band-Aids can irritate the skin.  You should call the office for your biopsy report after 1 week if you have not already been contacted.  If you experience any problems, such as abnormal amounts of bleeding, swelling, significant bruising, significant pain, or evidence of infection, please call the office immediately.  FOR ADULT SURGERY PATIENTS: If you need something for pain relief you may take 1 extra strength Tylenol (acetaminophen) AND 2 Ibuprofen (200mg  each) together every 4 hours as needed for pain. (do not take these if you are allergic to them or if you have a reason you should not take them.) Typically, you may only need pain medication for 1 to 3 days.      If You Need Anything After Your Visit  If you have any questions or concerns for your doctor, please call our main line at [phone] and press option 4 to reach your doctor's medical assistant. If no one answers, please leave a voicemail as directed and we will return your call as soon as possible. Messages left after 4 pm will be answered the following business day.   You may also send [place] a message via [place]. We typically respond to MyChart messages within 1-2 business days.  For prescription refills, please ask your  pharmacy to contact our office. Our fax number is [phone].  If you have an urgent issue when the clinic is closed that cannot wait until the next business day, you can page your doctor at the number below.    Please note that while we do our best to be available for urgent issues outside of office hours, we are not available 24/7.   If you have an urgent issue and are unable to reach [place], you may choose to seek medical care at your doctor's office, retail clinic, urgent care center, or emergency room.  If you have a medical emergency, please immediately call 911 or go to the emergency department.  Pager Numbers  - Dr. Nehemiah Massed: [phone]  - Dr. Laurence Ferrari: [phone]  - Dr. Nicole Kindred: [phone]  In the event of inclement weather, please call our main line at [phone] for an update on the status of any delays or closures.  Dermatology Medication Tips: Please keep the boxes that topical medications come in in order to help keep track of the instructions about where and how to use these. Pharmacies typically print the medication instructions only on the boxes and not directly on the medication tubes.   If your medication is too expensive, please contact our office at [phone] option 4 or send [place] a message through [place].   We are unable to tell what your co-pay for medications will be in advance as this is different depending on  your insurance coverage. However, we may be able to find a substitute medication at lower cost or fill out paperwork to get insurance to cover a needed medication.   If a prior authorization is required to get your medication covered by your insurance company, please allow [place] 1-2 business days to complete this process.  Drug prices often vary depending on where the prescription is filled and some pharmacies may offer cheaper prices.  The website www.goodrx.com contains coupons for medications through different pharmacies. The prices here do not  account for what the cost may be with help from insurance (it may be cheaper with your insurance), but the website can give you the price if you did not use any insurance.  - You can print the associated coupon and take it with your prescription to the pharmacy.  - You may also stop by our office during regular business hours and pick up a GoodRx coupon card.  - If you need your prescription sent electronically to a different pharmacy, notify our office through [facility] or by phone at [phone] option 4.     Si Usted Necesita Algo Despus de Su Visita  Tambin puede enviarnos un mensaje a travs de Pharmacist, community. Por lo general respondemos a los mensajes de MyChart en el transcurso de 1 a 2 das hbiles.  Para renovar recetas, por favor pida a su farmacia que se ponga en contacto con nuestra oficina. Harland Dingwall de fax es [facility] [phone].  Si tiene un asunto urgente cuando la clnica est cerrada y que no puede esperar hasta el siguiente da hbil, puede llamar/localizar a su doctor(a) al nmero que aparece a continuacin.   Por favor, tenga en cuenta que aunque hacemos todo lo posible para estar disponibles para asuntos urgentes fuera del horario de [place], no estamos disponibles las 24 horas del da, los 7 das de la [place].   Si tiene un problema urgente y no puede comunicarse con nosotros, puede optar por buscar atencin mdica  en el consultorio de su doctor(a), en una clnica privada, en un centro de atencin urgente o en una sala de emergencias.  Si tiene Engineering geologist, por favor llame inmediatamente al 911 o vaya a la sala de emergencias.  Nmeros de bper  - Dr. Nehemiah Massed: [phone]  - Dra. Moye: [phone]  - Dra. Nicole Kindred: [phone]  En caso de inclemencias del [place], por favor llame a Johnsie Kindred principal al [phone] para una actualizacin sobre el [place] de cualquier retraso o cierre.  Consejos para la medicacin en dermatologa: Por  favor, guarde las cajas en las que vienen los medicamentos de uso tpico para ayudarle a seguir las instrucciones sobre dnde y cmo usarlos. Las farmacias generalmente imprimen las instrucciones del medicamento slo en las cajas y no directamente en los tubos del [place].   Si su medicamento es muy caro, por favor, pngase en contacto con Zigmund Daniel llamando al [phone] y presione la opcin 4 o envenos un mensaje a travs de Pharmacist, community.   No podemos decirle cul ser su copago por los medicamentos por adelantado ya que esto es diferente dependiendo de la cobertura de su seguro. Sin embargo, es posible que podamos encontrar un medicamento sustituto a Electrical engineer un formulario para que el seguro cubra el medicamento que se considera necesario.   Si se requiere una autorizacin previa para que su compaa de seguros [place] su medicamento, por favor permtanos de 1 a 2 das hbiles para completar este proceso.  Los precios de los medicamentos varan con frecuencia dependiendo del Environmental consultant de dnde se surte la receta y alguna farmacias pueden ofrecer precios ms baratos.  El sitio web www.goodrx.com tiene cupones para medicamentos de Airline pilot. Los precios aqu no tienen en cuenta lo que podra costar con la ayuda del seguro (puede ser ms barato con su seguro), pero el sitio web puede darle el precio si no utiliz Research scientist (physical sciences).  - Puede imprimir el cupn correspondiente y llevarlo con su receta a la farmacia.  - Tambin puede pasar por nuestra oficina durante el horario de atencin regular y Charity fundraiser una tarjeta de cupones de GoodRx.  - Si necesita que su receta se enve electrnicamente a una farmacia diferente, informe a nuestra oficina a travs de MyChart de [place] o por telfono llamando al [phone] y presione la opcin 4.

## 2021-01-30 ENCOUNTER — Telehealth: Payer: Self-pay

## 2021-01-30 NOTE — Telephone Encounter (Signed)
Advised patient biopsy of the right medial heel was dysplastic nevus with moderate to severe atypia. Shave removal scheduled for 03/13/2020 at 1:30pm.

## 2021-01-30 NOTE — Telephone Encounter (Signed)
-----   Message from Brendolyn Patty, MD sent at 01/27/2021  9:56 PM EST ----- DYSPLASTIC JUNCTIONAL [place], CLOSE TO MARGIN  Mod/severely atypical mole, needs repeat shave removal - please call patient

## 2021-03-13 ENCOUNTER — Ambulatory Visit: Payer: BC Managed Care – PPO | Admitting: Dermatology

## 2021-03-13 ENCOUNTER — Other Ambulatory Visit: Payer: Self-pay

## 2021-03-13 ENCOUNTER — Encounter: Payer: Self-pay | Admitting: Dermatology

## 2021-03-13 DIAGNOSIS — D2371 Other benign neoplasm of skin of right lower limb, including hip: Secondary | ICD-10-CM | POA: Diagnosis not present

## 2021-03-13 DIAGNOSIS — D239 Other benign neoplasm of skin, unspecified: Secondary | ICD-10-CM

## 2021-03-13 NOTE — Patient Instructions (Signed)

## 2021-03-13 NOTE — Progress Notes (Signed)
° °  Follow-Up Visit   Subjective  Penny Monroe is a 66 y.o. female who presents for the following: Dysplastic Nevus moderate to severe, bx proven (R medial heel, pt presents for shave removal).   The following portions of the chart were reviewed this encounter and updated as appropriate:       Review of Systems:  No other skin or systemic complaints except as noted in HPI or Assessment and Plan.  Objective  Well appearing patient in no apparent distress; mood and affect are within normal limits.  A focused examination was performed including right foot. Relevant physical exam findings are noted in the Assessment and Plan.  R medial heel 4.26mm pink bx site    Assessment & Plan  Dysplastic nevus R medial heel  Moderate to severe atypia Bx proven  Repeat shave removal today   Epidermal / dermal shaving - R medial heel  Lesion diameter (cm):  0.7 Informed consent: discussed and consent obtained   Timeout: patient name, date of birth, surgical site, and procedure verified   Procedure prep:  Patient was prepped and draped in usual sterile fashion Prep type:  Isopropyl alcohol Anesthesia: the lesion was anesthetized in a standard fashion   Anesthetic:  1% lidocaine w/ epinephrine 1-100,000 buffered w/ 8.4% NaHCO3 Instrument used: flexible razor blade   Hemostasis achieved with: pressure, aluminum chloride and electrodesiccation   Outcome: patient tolerated procedure well   Post-procedure details: sterile dressing applied and wound care instructions given   Dressing type: bandage and petrolatum    Specimen 1 - Surgical pathology Differential Diagnosis: D48.5 Bx proven Moderate to severe dysplastic nevus  Check Margins: yes 4.49mm pink bx site [phone]   Return for as scheduled for TBSE, Hx of Dysplastic nevi.  I, Othelia Pulling, RMA, am acting as scribe for Brendolyn Patty, MD .  Documentation: I have reviewed the above documentation for accuracy and completeness, and I  agree with the above.  Brendolyn Patty MD

## 2021-03-15 ENCOUNTER — Telehealth: Payer: Self-pay

## 2021-03-15 NOTE — Telephone Encounter (Signed)
-----   Message from Brendolyn Patty, MD sent at 03/14/2021  6:55 PM EST ----- Skin , right medial heel NO RESIDUAL DYSPLASTIC NEVUS, MARGINS FREE  s/p shave removal dysplastic nevus, margins free   - please call patient

## 2021-03-15 NOTE — Telephone Encounter (Signed)
Advised pt of bx results/sh ?

## 2021-11-06 ENCOUNTER — Other Ambulatory Visit: Payer: Self-pay | Admitting: Obstetrics and Gynecology

## 2021-11-06 DIAGNOSIS — Z1231 Encounter for screening mammogram for malignant neoplasm of breast: Secondary | ICD-10-CM

## 2022-01-01 ENCOUNTER — Ambulatory Visit
Admission: RE | Admit: 2022-01-01 | Discharge: 2022-01-01 | Disposition: A | Discharge status: Home / Self Care | Payer: BC Managed Care – PPO | Source: Ambulatory Visit | Attending: Obstetrics and Gynecology | Admitting: Obstetrics and Gynecology

## 2022-01-01 DIAGNOSIS — Z1231 Encounter for screening mammogram for malignant neoplasm of breast: Secondary | ICD-10-CM | POA: Diagnosis present

## 2022-01-30 ENCOUNTER — Ambulatory Visit: Payer: BC Managed Care – PPO | Admitting: Dermatology

## 2022-01-30 ENCOUNTER — Encounter: Payer: Self-pay | Admitting: Dermatology

## 2022-01-30 DIAGNOSIS — D2262 Melanocytic nevi of left upper limb, including shoulder: Secondary | ICD-10-CM | POA: Diagnosis not present

## 2022-01-30 DIAGNOSIS — Z1283 Encounter for screening for malignant neoplasm of skin: Secondary | ICD-10-CM

## 2022-01-30 DIAGNOSIS — L700 Acne vulgaris: Secondary | ICD-10-CM

## 2022-01-30 DIAGNOSIS — Z86018 Personal history of other benign neoplasm: Secondary | ICD-10-CM

## 2022-01-30 DIAGNOSIS — D171 Benign lipomatous neoplasm of skin and subcutaneous tissue of trunk: Secondary | ICD-10-CM

## 2022-01-30 DIAGNOSIS — D229 Melanocytic nevi, unspecified: Secondary | ICD-10-CM

## 2022-01-30 DIAGNOSIS — L820 Inflamed seborrheic keratosis: Secondary | ICD-10-CM

## 2022-01-30 DIAGNOSIS — D225 Melanocytic nevi of trunk: Secondary | ICD-10-CM

## 2022-01-30 DIAGNOSIS — Q825 Congenital non-neoplastic nevus: Secondary | ICD-10-CM

## 2022-01-30 NOTE — Patient Instructions (Signed)
Recommend daily broad spectrum sunscreen SPF 30+ to sun-exposed areas, reapply every 2 hours as needed. Call for new or changing lesions.  Staying in the shade or wearing long sleeves, sun glasses (UVA+UVB protection) and wide brim hats (4-inch brim around the entire circumference of the hat) are also recommended for sun protection.    Melanoma ABCDEs  Melanoma is the most dangerous type of skin cancer, and is the leading cause of death from skin disease.  You are more likely to develop melanoma if you: Have light-colored skin, light-colored eyes, or red or blond hair Spend a lot of time in the sun Tan regularly, either outdoors or in a tanning bed Have had blistering sunburns, especially during childhood Have a close family member who has had a melanoma Have atypical moles or large birthmarks  Early detection of melanoma is key since treatment is typically straightforward and cure rates are extremely high if we catch it early.   The first sign of melanoma is often a change in a mole or a new dark spot.  The ABCDE system is a way of remembering the signs of melanoma.  A for asymmetry:  The two halves do not match. B for border:  The edges of the growth are irregular. C for color:  A mixture of colors are present instead of an even brown color. D for diameter:  Melanomas are usually (but not always) greater than 6mm - the size of a pencil eraser. E for evolution:  The spot keeps changing in size, shape, and color.  Please check your skin once per month between visits. You can use a small mirror in front and a large mirror behind you to keep an eye on the back side or your body.   If you see any new or changing lesions before your next follow-up, please call to schedule a visit.  Please continue daily skin protection including broad spectrum sunscreen SPF 30+ to sun-exposed areas, reapplying every 2 hours as needed when you're outdoors.   Staying in the shade or wearing long sleeves, sun  glasses (UVA+UVB protection) and wide brim hats (4-inch brim around the entire circumference of the hat) are also recommended for sun protection.     Due to recent changes in healthcare laws, you may see results of your pathology and/or laboratory studies on MyChart before the doctors have had a chance to review them. We understand that in some cases there may be results that are confusing or concerning to you. Please understand that not all results are received at the same time and often the doctors may need to interpret multiple results in order to provide you with the best plan of care or course of treatment. Therefore, we ask that you please give us 2 business days to thoroughly review all your results before contacting the office for clarification. Should we see a critical lab result, you will be contacted sooner.   If You Need Anything After Your Visit  If you have any questions or concerns for your doctor, please call our main line at [phone] and press option 4 to reach your doctor's medical assistant. If no one answers, please leave a voicemail as directed and we will return your call as soon as possible. Messages left after 4 pm will be answered the following business day.   You may also send us a message via MyChart. We typically respond to MyChart messages within 1-2 business days.  For prescription refills, please ask your pharmacy to contact   our office. Our fax number is [phone].  If you have an urgent issue when the clinic is closed that cannot wait until the next business day, you can page your doctor at the number below.    Please note that while we do our best to be available for urgent issues outside of office hours, we are not available 24/7.   If you have an urgent issue and are unable to reach us, you may choose to seek medical care at your doctor's office, retail clinic, urgent care center, or emergency room.  If you have a medical emergency, please immediately  call 911 or go to the emergency department.  Pager Numbers  - Dr. Kowalski: [phone]  - Dr. Moye: [phone]  - Dr. Stewart: [phone]  In the event of inclement weather, please call our main line at [phone] for an update on the status of any delays or closures.  Dermatology Medication Tips: Please keep the boxes that topical medications come in in order to help keep track of the instructions about where and how to use these. Pharmacies typically print the medication instructions only on the boxes and not directly on the medication tubes.   If your medication is too expensive, please contact our office at [phone] option 4 or send us a message through MyChart.   We are unable to tell what your co-pay for medications will be in advance as this is different depending on your insurance coverage. However, we may be able to find a substitute medication at lower cost or fill out paperwork to get insurance to cover a needed medication.   If a prior authorization is required to get your medication covered by your insurance company, please allow us 1-2 business days to complete this process.  Drug prices often vary depending on where the prescription is filled and some pharmacies may offer cheaper prices.  The website www.goodrx.com contains coupons for medications through different pharmacies. The prices here do not account for what the cost may be with help from insurance (it may be cheaper with your insurance), but the website can give you the price if you did not use any insurance.  - You can print the associated coupon and take it with your prescription to the pharmacy.  - You may also stop by our office during regular business hours and pick up a GoodRx coupon card.  - If you need your prescription sent electronically to a different pharmacy, notify our office through [place] MyChart or by phone at [phone] option 4.     Si Usted Necesita Algo Despus de Su  Visita  Tambin puede enviarnos un mensaje a travs de MyChart. Por lo general respondemos a los mensajes de MyChart en el transcurso de 1 a 2 das hbiles.  Para renovar recetas, por favor pida a su farmacia que se ponga en contacto con nuestra oficina. Nuestro nmero de fax es el [phone].  Si tiene un asunto urgente cuando la clnica est cerrada y que no puede esperar hasta el siguiente da hbil, puede llamar/localizar a su doctor(a) al nmero que aparece a continuacin.   Por favor, tenga en cuenta que aunque hacemos todo lo posible para estar disponibles para asuntos urgentes fuera del horario de oficina, no estamos disponibles las 24 horas del da, los 7 das de la semana.   Si tiene un problema urgente y no puede comunicarse con nosotros, puede optar por buscar atencin mdica  en el consultorio de su doctor(a), en una clnica privada,   en un centro de atencin urgente o en una sala de emergencias.  Si tiene una emergencia mdica, por favor llame inmediatamente al 911 o vaya a la sala de emergencias.  Nmeros de bper  - Dr. Kowalski: [phone]  - Dra. Moye: [phone]  - Dra. Stewart: [phone]  En caso de inclemencias del tiempo, por favor llame a nuestra lnea principal al [phone] para una actualizacin sobre el estado de cualquier retraso o cierre.  Consejos para la medicacin en dermatologa: Por favor, guarde las cajas en las que vienen los medicamentos de uso tpico para ayudarle a seguir las instrucciones sobre dnde y cmo usarlos. Las farmacias generalmente imprimen las instrucciones del medicamento slo en las cajas y no directamente en los tubos del medicamento.   Si su medicamento es muy caro, por favor, pngase en contacto con nuestra oficina llamando al [phone] y presione la opcin 4 o envenos un mensaje a travs de MyChart.   No podemos decirle cul ser su copago por los medicamentos por adelantado ya que esto es diferente dependiendo de la  cobertura de su seguro. Sin embargo, es posible que podamos encontrar un medicamento sustituto a menor costo o llenar un formulario para que el seguro cubra el medicamento que se considera necesario.   Si se requiere una autorizacin previa para que su compaa de seguros cubra su medicamento, por favor permtanos de 1 a 2 das hbiles para completar este proceso.  Los precios de los medicamentos varan con frecuencia dependiendo del lugar de dnde se surte la receta y alguna farmacias pueden ofrecer precios ms baratos.  El sitio web www.goodrx.com tiene cupones para medicamentos de diferentes farmacias. Los precios aqu no tienen en cuenta lo que podra costar con la ayuda del seguro (puede ser ms barato con su seguro), pero el sitio web puede darle el precio si no utiliz ningn seguro.  - Puede imprimir el cupn correspondiente y llevarlo con su receta a la farmacia.  - Tambin puede pasar por nuestra oficina durante el horario de atencin regular y recoger una tarjeta de cupones de GoodRx.  - Si necesita que su receta se enve electrnicamente a una farmacia diferente, informe a nuestra oficina a travs de MyChart de [place] o por telfono llamando al [phone] y presione la opcin 4.  

## 2022-01-30 NOTE — Progress Notes (Signed)
Follow-Up Visit   Subjective  Penny Monroe is a 66 y.o. female who presents for the following: Annual Exam (Hx of dysplastic nevus with mod-severe atypia). R medial heel.  The patient presents for Total-Body Skin Exam (TBSE) for skin cancer screening and mole check.  The patient has spots, moles and lesions to be evaluated, some may be new or changing and the patient has concerns that these could be cancer.   The following portions of the chart were reviewed this encounter and updated as appropriate:      Review of Systems: No other skin or systemic complaints except as noted in HPI or Assessment and Plan.   Objective  Well appearing patient in no apparent distress; mood and affect are within normal limits.  A full examination was performed including scalp, head, eyes, ears, nose, lips, neck, chest, axillae, abdomen, back, buttocks, bilateral upper extremities, bilateral lower extremities, hands, feet, fingers, toes, fingernails, and toenails. All findings within normal limits unless otherwise noted below.  Right Spinal Mid Back Erythematous keratotic or waxy stuck-on papule  Left Forearm, Left Inframammary 2.5 mm brown macules at left forearm and left inframammary   Nose Open comedone  Left Lower Leg - Posterior, Left Thigh - Posterior 0.8cm speckled light brown papule at left posterior thigh.   1.0cm brown papule at left calf  right anterior flank 1.5cm rubbery nodule   Assessment & Plan   History of Dysplastic Nevus. Right medial heel. Mod-severe. Re-shaved 03/13/2020 - No evidence of recurrence today - Recommend regular full body skin exams - Recommend daily broad spectrum sunscreen SPF 30+ to sun-exposed areas, reapply every 2 hours as needed.  - Call if any new or changing lesions are noted between office visits   Lentigines - Scattered tan macules - Due to sun exposure - Benign-appearing, observe - Recommend daily broad spectrum sunscreen SPF 30+ to  sun-exposed areas, reapply every 2 hours as needed. - Call for any changes  Seborrheic Keratoses - Stuck-on, waxy, tan-brown papules and/or plaques  - Benign-appearing - Discussed benign etiology and prognosis. - Observe - Call for any changes  Melanocytic Nevi - Tan-brown and/or pink-flesh-colored symmetric macules and papules - Benign appearing on exam today - Observation - Call clinic for new or changing moles - Recommend daily use of broad spectrum spf 30+ sunscreen to sun-exposed areas.   Hemangiomas - Red papules - Discussed benign nature - Observe - Call for any changes  Actinic Damage - Chronic condition, secondary to cumulative UV/sun exposure - diffuse scaly erythematous macules with underlying dyspigmentation - Recommend daily broad spectrum sunscreen SPF 30+ to sun-exposed areas, reapply every 2 hours as needed.  - Staying in the shade or wearing long sleeves, sun glasses (UVA+UVB protection) and wide brim hats (4-inch brim around the entire circumference of the hat) are also recommended for sun protection.  - Call for new or changing lesions.  Skin cancer screening performed today.  Sebaceous Hyperplasia. Forehead.  - Small yellow papules with a central dell - Benign - Observe   Inflamed seborrheic keratosis Right Spinal Mid Back  Not bothersome to pt. Reassured benign age-related growth.  Recommend observation.  Discussed cryotherapy if spot(s) become irritated or inflamed.    Nevus (2) Left Forearm; Left Inframammary  Benign-appearing. Stable compared to previous visit. Observation.  Call clinic for new or changing moles.  Recommend daily use of broad spectrum spf 30+ sunscreen to sun-exposed areas.    Open comedone Nose  Benign, observe.  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 Stable compared to previous visit. Observation.  Call clinic for new or changing moles.  Recommend  daily use of broad spectrum spf 30+ sunscreen to sun-exposed areas.    Lipoma of torso right anterior flank  Benign-appearing. Stable compared to previous visit. Observation.  Call clinic for new or changing moles.  Recommend daily use of broad spectrum spf 30+ sunscreen to sun-exposed areas.     Return in about 1 year (around 01/31/2023) for TBSE, HxDN.  I, Emelia Salisbury, CMA, am acting as scribe for Brendolyn Patty, MD.  Documentation: I have reviewed the above documentation for accuracy and completeness, and I agree with the above.  Brendolyn Patty MD

## 2022-09-07 DIAGNOSIS — M810 Age-related osteoporosis without current pathological fracture: Secondary | ICD-10-CM | POA: Diagnosis not present

## 2022-09-07 DIAGNOSIS — R7302 Impaired glucose tolerance (oral): Secondary | ICD-10-CM | POA: Diagnosis not present

## 2022-09-07 DIAGNOSIS — Z0000 Encounter for general adult medical examination without abnormal findings: Secondary | ICD-10-CM | POA: Diagnosis not present

## 2022-09-07 DIAGNOSIS — R7989 Other specified abnormal findings of blood chemistry: Secondary | ICD-10-CM | POA: Diagnosis not present

## 2022-09-26 DIAGNOSIS — M8589 Other specified disorders of bone density and structure, multiple sites: Secondary | ICD-10-CM | POA: Diagnosis not present

## 2022-09-26 DIAGNOSIS — Z0000 Encounter for general adult medical examination without abnormal findings: Secondary | ICD-10-CM | POA: Diagnosis not present

## 2022-10-16 DIAGNOSIS — M8588 Other specified disorders of bone density and structure, other site: Secondary | ICD-10-CM | POA: Diagnosis not present

## 2022-12-06 DIAGNOSIS — H25813 Combined forms of age-related cataract, bilateral: Secondary | ICD-10-CM | POA: Diagnosis not present

## 2022-12-26 ENCOUNTER — Other Ambulatory Visit: Payer: Self-pay | Admitting: Obstetrics and Gynecology

## 2022-12-26 DIAGNOSIS — Z1231 Encounter for screening mammogram for malignant neoplasm of breast: Secondary | ICD-10-CM

## 2023-01-11 ENCOUNTER — Ambulatory Visit
Admission: RE | Admit: 2023-01-11 | Discharge: 2023-01-11 | Disposition: A | Discharge status: Home / Self Care | Payer: PPO | Source: Ambulatory Visit | Attending: Obstetrics and Gynecology | Admitting: Obstetrics and Gynecology

## 2023-01-11 DIAGNOSIS — Z1231 Encounter for screening mammogram for malignant neoplasm of breast: Secondary | ICD-10-CM | POA: Insufficient documentation

## 2023-02-05 ENCOUNTER — Ambulatory Visit: Payer: PPO | Admitting: Dermatology

## 2023-02-05 DIAGNOSIS — L738 Other specified follicular disorders: Secondary | ICD-10-CM

## 2023-02-05 DIAGNOSIS — Q825 Congenital non-neoplastic nevus: Secondary | ICD-10-CM

## 2023-02-05 DIAGNOSIS — L814 Other melanin hyperpigmentation: Secondary | ICD-10-CM

## 2023-02-05 DIAGNOSIS — D229 Melanocytic nevi, unspecified: Secondary | ICD-10-CM | POA: Diagnosis not present

## 2023-02-05 DIAGNOSIS — D2239 Melanocytic nevi of other parts of face: Secondary | ICD-10-CM

## 2023-02-05 DIAGNOSIS — D225 Melanocytic nevi of trunk: Secondary | ICD-10-CM | POA: Diagnosis not present

## 2023-02-05 DIAGNOSIS — Z1283 Encounter for screening for malignant neoplasm of skin: Secondary | ICD-10-CM

## 2023-02-05 DIAGNOSIS — W908XXA Exposure to other nonionizing radiation, initial encounter: Secondary | ICD-10-CM | POA: Diagnosis not present

## 2023-02-05 DIAGNOSIS — L578 Other skin changes due to chronic exposure to nonionizing radiation: Secondary | ICD-10-CM | POA: Diagnosis not present

## 2023-02-05 DIAGNOSIS — L918 Other hypertrophic disorders of the skin: Secondary | ICD-10-CM

## 2023-02-05 DIAGNOSIS — L821 Other seborrheic keratosis: Secondary | ICD-10-CM

## 2023-02-05 DIAGNOSIS — D2262 Melanocytic nevi of left upper limb, including shoulder: Secondary | ICD-10-CM | POA: Diagnosis not present

## 2023-02-05 DIAGNOSIS — Z86018 Personal history of other benign neoplasm: Secondary | ICD-10-CM

## 2023-02-05 DIAGNOSIS — D1801 Hemangioma of skin and subcutaneous tissue: Secondary | ICD-10-CM

## 2023-02-05 NOTE — Patient Instructions (Addendum)

## 2023-02-05 NOTE — Progress Notes (Signed)
Follow-Up Visit   Subjective  Penny Monroe is a 67 y.o. female who presents for the following: Skin Cancer Screening and Full Body Skin Exam hx of dysplastic nevi Hx of dysplastic nevus hx of isk, hx of nevi and hx of congenital nevi   The patient presents for Total-Body Skin Exam (TBSE) for skin cancer screening and mole check. The patient has spots, moles and lesions to be evaluated, some may be new or changing and the patient may have concern these could be cancer.    The following portions of the chart were reviewed this encounter and updated as appropriate: medications, allergies, medical history  Review of Systems:  No other skin or systemic complaints except as noted in HPI or Assessment and Plan.  Objective  Well appearing patient in no apparent distress; mood and affect are within normal limits.  A full examination was performed including scalp, head, eyes, ears, nose, lips, neck, chest, axillae, abdomen, back, buttocks, bilateral upper extremities, bilateral lower extremities, hands, feet, fingers, toes, fingernails, and toenails. All findings within normal limits unless otherwise noted below.   Relevant physical exam findings are noted in the Assessment and Plan.    Assessment & Plan   SKIN CANCER SCREENING PERFORMED TODAY.  ACTINIC DAMAGE - Chronic condition, secondary to cumulative UV/sun exposure - diffuse scaly erythematous macules with underlying dyspigmentation - Recommend daily broad spectrum sunscreen SPF 30+ to sun-exposed areas, reapply every 2 hours as needed.  - Staying in the shade or wearing long sleeves, sun glasses (UVA+UVB protection) and wide brim hats (4-inch brim around the entire circumference of the hat) are also recommended for sun protection.  - Call for new or changing lesions.  LENTIGINES, SEBORRHEIC KERATOSES, HEMANGIOMAS - Benign normal skin lesions - Benign-appearing - Call for any changes Sk at left breast, spinal mid back, left axilla,  right spinal mid back   MELANOCYTIC NEVI - Tan-brown and/or pink-flesh-colored symmetric macules and papules - Benign appearing on exam today - Observation - Call clinic for new or changing moles - Recommend daily use of broad spectrum spf 30+ sunscreen to sun-exposed areas.   Nevus   3 mm medium brown macule for left forearm   2.5 mm med brown macule at  left inframammary  2 mm brown papule with underlying flesh papule on left mid cheek    Benign-appearing. Stable compared to previous visit. Observation.  Call clinic for new or changing moles.  Recommend daily use of broad spectrum spf 30+ sunscreen to sun-exposed areas.     Congenital non-neoplastic nevus  Exam:  0.8 cm light brown papule at left posterior thigh.  1.0 cm thin brown papule at left calf  Benign-appearing. Stable compared to previous visit. Observation.  Call clinic for new or changing moles.  Recommend daily use of broad spectrum spf 30+ sunscreen to sun-exposed areas.    Acrochordons (Skin Tags) - Fleshy, skin-colored pedunculated papules - Benign appearing.  - Observe. - If desired, they can be removed with an in office procedure that is not covered by insurance. - Please call the clinic if you notice any new or changing lesions.   Sebaceous Hyperplasia. Forehead.  - Small yellow papules with a central dell - Benign - Observe   History of Dysplastic Nevus. Right medial heel. Mod-severe. Re-shaved 03/13/2020 - No evidence of recurrence today - Recommend regular full body skin exams - Recommend daily broad spectrum sunscreen SPF 30+ to sun-exposed areas, reapply every 2 hours as needed.  - Call  if any new or changing lesions are noted between office visits   Return in about 1 year (around 02/05/2024) for TBSE.  I, Asher Muir, CMA, am acting as scribe for Willeen Niece, MD.   Documentation: I have reviewed the above documentation for accuracy and completeness, and I agree with the above.  Willeen Niece, MD

## 2023-04-03 DIAGNOSIS — R55 Syncope and collapse: Secondary | ICD-10-CM | POA: Diagnosis not present

## 2023-05-02 DIAGNOSIS — R569 Unspecified convulsions: Secondary | ICD-10-CM | POA: Diagnosis not present

## 2023-05-02 DIAGNOSIS — R55 Syncope and collapse: Secondary | ICD-10-CM | POA: Diagnosis not present

## 2023-05-03 ENCOUNTER — Other Ambulatory Visit: Payer: Self-pay | Admitting: Neurology

## 2023-05-03 DIAGNOSIS — R55 Syncope and collapse: Secondary | ICD-10-CM

## 2023-05-08 DIAGNOSIS — R55 Syncope and collapse: Secondary | ICD-10-CM | POA: Diagnosis not present

## 2023-05-08 DIAGNOSIS — E538 Deficiency of other specified B group vitamins: Secondary | ICD-10-CM | POA: Diagnosis not present

## 2023-05-13 ENCOUNTER — Ambulatory Visit
Admission: RE | Admit: 2023-05-13 | Discharge: 2023-05-13 | Disposition: A | Discharge status: Home / Self Care | Source: Ambulatory Visit | Attending: Neurology | Admitting: Neurology

## 2023-05-13 DIAGNOSIS — R55 Syncope and collapse: Secondary | ICD-10-CM | POA: Insufficient documentation

## 2023-05-13 DIAGNOSIS — J341 Cyst and mucocele of nose and nasal sinus: Secondary | ICD-10-CM | POA: Diagnosis not present

## 2023-05-13 MED ORDER — GADOBUTROL 1 MMOL/ML IV SOLN
4.0000 mL | Freq: Once | INTRAVENOUS | Status: AC | PRN
  Administered 2023-05-13: 4 mL via INTRAVENOUS

## 2023-06-08 DIAGNOSIS — R569 Unspecified convulsions: Secondary | ICD-10-CM | POA: Diagnosis not present

## 2023-08-07 DIAGNOSIS — Z1331 Encounter for screening for depression: Secondary | ICD-10-CM | POA: Diagnosis not present

## 2023-08-07 DIAGNOSIS — R569 Unspecified convulsions: Secondary | ICD-10-CM | POA: Diagnosis not present

## 2023-08-07 DIAGNOSIS — R55 Syncope and collapse: Secondary | ICD-10-CM | POA: Diagnosis not present

## 2023-08-07 DIAGNOSIS — E538 Deficiency of other specified B group vitamins: Secondary | ICD-10-CM | POA: Diagnosis not present

## 2023-08-23 DIAGNOSIS — R55 Syncope and collapse: Secondary | ICD-10-CM | POA: Diagnosis not present

## 2023-08-23 DIAGNOSIS — Z7689 Persons encountering health services in other specified circumstances: Secondary | ICD-10-CM | POA: Diagnosis not present

## 2023-08-29 DIAGNOSIS — R55 Syncope and collapse: Secondary | ICD-10-CM | POA: Diagnosis not present

## 2023-09-05 DIAGNOSIS — R55 Syncope and collapse: Secondary | ICD-10-CM | POA: Diagnosis not present

## 2023-09-10 DIAGNOSIS — R55 Syncope and collapse: Secondary | ICD-10-CM | POA: Diagnosis not present

## 2023-09-24 DIAGNOSIS — Z1211 Encounter for screening for malignant neoplasm of colon: Secondary | ICD-10-CM | POA: Diagnosis not present

## 2023-09-24 DIAGNOSIS — Z01818 Encounter for other preprocedural examination: Secondary | ICD-10-CM | POA: Diagnosis not present

## 2023-09-30 DIAGNOSIS — Z0000 Encounter for general adult medical examination without abnormal findings: Secondary | ICD-10-CM | POA: Diagnosis not present

## 2023-10-03 DIAGNOSIS — R569 Unspecified convulsions: Secondary | ICD-10-CM | POA: Diagnosis not present

## 2023-10-07 DIAGNOSIS — R55 Syncope and collapse: Secondary | ICD-10-CM | POA: Diagnosis not present

## 2023-10-07 DIAGNOSIS — I340 Nonrheumatic mitral (valve) insufficiency: Secondary | ICD-10-CM | POA: Diagnosis not present

## 2023-10-22 DIAGNOSIS — R569 Unspecified convulsions: Secondary | ICD-10-CM | POA: Diagnosis not present

## 2023-11-15 ENCOUNTER — Encounter: Payer: Self-pay | Admitting: *Deleted

## 2023-11-18 ENCOUNTER — Ambulatory Visit
Admission: RE | Admit: 2023-11-18 | Discharge: 2023-11-18 | Disposition: A | Discharge status: Home / Self Care | Attending: Gastroenterology | Admitting: Gastroenterology

## 2023-11-18 ENCOUNTER — Encounter
Admission: RE | Disposition: A | Discharge status: Home / Self Care | Payer: Self-pay | Source: Home / Self Care | Attending: Gastroenterology

## 2023-11-18 ENCOUNTER — Ambulatory Visit: Admitting: Certified Registered"

## 2023-11-18 DIAGNOSIS — Q438 Other specified congenital malformations of intestine: Secondary | ICD-10-CM | POA: Insufficient documentation

## 2023-11-18 DIAGNOSIS — Z1211 Encounter for screening for malignant neoplasm of colon: Secondary | ICD-10-CM | POA: Diagnosis not present

## 2023-11-18 DIAGNOSIS — Z539 Procedure and treatment not carried out, unspecified reason: Secondary | ICD-10-CM | POA: Diagnosis not present

## 2023-11-18 HISTORY — DX: Diffuse cystic mastopathy of unspecified breast: N60.19

## 2023-11-18 HISTORY — DX: Diverticulosis of intestine, part unspecified, without perforation or abscess without bleeding: K57.90

## 2023-11-18 HISTORY — PX: COLONOSCOPY: SHX5424

## 2023-11-18 HISTORY — DX: Age-related osteoporosis without current pathological fracture: M81.0

## 2023-11-18 SURGERY — COLONOSCOPY
Anesthesia: General

## 2023-11-18 MED ORDER — PROPOFOL 500 MG/50ML IV EMUL
INTRAVENOUS | Status: DC | PRN
  Administered 2023-11-18: 120 ug/kg/min via INTRAVENOUS

## 2023-11-18 MED ORDER — LIDOCAINE 2% (20 MG/ML) 5 ML SYRINGE
INTRAMUSCULAR | Status: DC | PRN
  Administered 2023-11-18: 20 mg via INTRAVENOUS

## 2023-11-18 MED ORDER — SODIUM CHLORIDE 0.9 % IV SOLN
INTRAVENOUS | Status: DC
  Administered 2023-11-18: 500 mL via INTRAVENOUS

## 2023-11-18 MED ORDER — PROPOFOL 10 MG/ML IV BOLUS
INTRAVENOUS | Status: DC | PRN
  Administered 2023-11-18: 50 mg via INTRAVENOUS

## 2023-11-18 NOTE — Transfer of Care (Signed)
 Immediate Anesthesia Transfer of Care Note  Patient: Penny Monroe  Procedure(s) Performed: COLONOSCOPY  Patient Location: Endoscopy Unit  Anesthesia Type:General  Level of Consciousness: drowsy  Airway & Oxygen Therapy: Patient Spontanous Breathing  Post-op Assessment: Report given to RN and Post -op Vital signs reviewed and stable  Post vital signs: Reviewed  Last Vitals:  Vitals Value Taken Time  BP 101/63 11/18/23 09:31  Temp    Pulse 67 11/18/23 09:32  Resp 16 11/18/23 09:32  SpO2 99 % 11/18/23 09:32  Vitals shown include unfiled device data.  Last Pain:  Vitals:   11/18/23 0842  TempSrc: Temporal  PainSc: 0-No pain         Complications: No notable events documented.

## 2023-11-18 NOTE — Anesthesia Preprocedure Evaluation (Signed)
 Anesthesia Evaluation  Patient identified by MRN, date of birth, ID band Patient awake    Reviewed: Allergy & Precautions, H&P , NPO status , Patient's Chart, lab work & pertinent test results, reviewed documented beta blocker date and time   Airway Mallampati: II   Neck ROM: full    Dental  (+) Poor Dentition   Pulmonary neg pulmonary ROS   Pulmonary exam normal        Cardiovascular Exercise Tolerance: Good negative cardio ROS Normal cardiovascular exam Rhythm:regular Rate:Normal     Neuro/Psych negative neurological ROS  negative psych ROS   GI/Hepatic negative GI ROS, Neg liver ROS,,,  Endo/Other  negative endocrine ROS    Renal/GU negative Renal ROS  negative genitourinary   Musculoskeletal   Abdominal   Peds  Hematology negative hematology ROS (+)   Anesthesia Other Findings Past Medical History: No date: Diverticulosis 01/24/2021: Dysplastic nevus     Comment:  Right medial heel, moderate to severe, shave removal               03/13/20 No date: Fibrocystic breast No date: Herpes genitalia No date: Osteopenia No date: Osteoporosis Past Surgical History: 1996 & 2000: BREAST EXCISIONAL BIOPSY; Right     Comment:  twice negative No date: COLONOSCOPY 04/24/2018: FLEXIBLE SIGMOIDOSCOPY; N/A     Comment:  Procedure: FLEXIBLE SIGMOIDOSCOPY;  Surgeon: Gaylyn Gladis PENNER, MD;  Location: [facility] ENDOSCOPY;  Service:               Endoscopy;  Laterality: N/A; No date: sclerotherapy vein leg BMI    Body Mass Index: 19.70 kg/m     Reproductive/Obstetrics negative OB ROS                              Anesthesia Physical Anesthesia Plan  ASA: 2  Anesthesia Plan: General   Post-op Pain Management:    Induction:   PONV Risk Score and Plan:   Airway Management Planned:   Additional Equipment:   Intra-op Plan:   Post-operative Plan:   Informed Consent: I have  reviewed the patients History and Physical, chart, labs and discussed the procedure including the risks, benefits and alternatives for the proposed anesthesia with the patient or authorized representative who has indicated his/her understanding and acceptance.     Dental Advisory Given  Plan Discussed with: CRNA  Anesthesia Plan Comments:         Anesthesia Quick Evaluation

## 2023-11-18 NOTE — Op Note (Signed)
 [facility] Patient Name: Penny Monroe Procedure Date: 11/18/2023 8:54 AM MRN: [id number] Account #: [id number] Date of Birth: August 27, 1955 Admit Type: Outpatient Age: 68 Room: [facility] ENDO ROOM 3 Gender: Female Note Status: Finalized Instrument Name: Peds Colonoscope [id number] Procedure:             Colonoscopy Indications:           Screening for colorectal malignant neoplasm Providers:             Ole Schick MD, MD Medicines:             Monitored Anesthesia Care Complications:         No immediate complications. Procedure:             Pre-Anesthesia Assessment:                        - Prior to the procedure, a History and Physical was                         performed, and patient medications and allergies were                         reviewed. The patient is competent. The risks and                         benefits of the procedure and the sedation options and                         risks were discussed with the patient. All questions                         were answered and informed consent was obtained.                         Patient identification and proposed procedure were                         verified by the physician, the nurse, the                         anesthesiologist, the anesthetist and the technician                         in the endoscopy suite. Mental Status Examination:                         alert and oriented. Airway Examination: normal                         oropharyngeal airway and neck mobility. Respiratory                         Examination: clear to auscultation. CV Examination:                         normal. Prophylactic Antibiotics: The patient does not                         require prophylactic antibiotics. Prior  Anticoagulants: The patient has taken no anticoagulant                         or antiplatelet agents. ASA Grade Assessment: II - A                         patient  with mild systemic disease. After reviewing                         the risks and benefits, the patient was deemed in                         satisfactory condition to undergo the procedure. The                         anesthesia plan was to use monitored anesthesia care                         (MAC). Immediately prior to administration of                         medications, the patient was re-assessed for adequacy                         to receive sedatives. The heart rate, respiratory                         rate, oxygen saturations, blood pressure, adequacy of                         pulmonary ventilation, and response to care were                         monitored throughout the procedure. The physical                         status of the patient was re-assessed after the                         procedure.                        After obtaining informed consent, the colonoscope was                         passed under direct vision. Throughout the procedure,                         the patient's blood pressure, pulse, and oxygen                         saturations were monitored continuously. The                         Colonoscope was introduced through the anus and                         advanced to the the cecum, identified by the ileocecal  valve. The colonoscopy was somewhat difficult due to a                         redundant colon. Successful completion of the                         procedure was aided by applying abdominal pressure.                         The patient tolerated the procedure well. The quality                         of the bowel preparation was inadequate. The ileocecal                         valve and the rectum were photographed. Findings:      The perianal and digital rectal examinations were normal.      A moderate amount of stool was found in the rectum, in the sigmoid       colon, in the ascending colon and in the cecum,  precluding visualization. Impression:            - Preparation of the colon was inadequate.                        - Stool in the rectum, in the sigmoid colon, in the                         ascending colon and in the cecum.                        - No specimens collected. Recommendation:        - Discharge patient to home.                        - Resume previous diet.                        - Continue present medications.                        - Repeat colonoscopy in 6 months because the bowel                         preparation was suboptimal.                        - Return to referring physician as previously                         scheduled. Procedure Code(s):     --- Professional ---                        H9878, Colorectal cancer screening; colonoscopy on                         individual not meeting criteria for high risk Diagnosis Code(s):     --- Professional ---  Z12.11, Encounter for screening for malignant neoplasm                         of colon CPT copyright 2022 [employer]. All rights reserved. The codes documented in this report are preliminary and upon coder review may  be revised to meet current compliance requirements. Ole Schick MD, MD 11/18/2023 9:33:46 AM Number of Addenda: 0 Note Initiated On: 11/18/2023 8:54 AM Scope Withdrawal Time: 0 hours 2 minutes 58 seconds  Total Procedure Duration: 0 hours 10 minutes 40 seconds  Estimated Blood Loss:  Estimated blood loss: none.      [facility]

## 2023-11-18 NOTE — Anesthesia Postprocedure Evaluation (Signed)
 Anesthesia Post Note  Patient: Penny Monroe  Procedure(s) Performed: COLONOSCOPY  Patient location during evaluation: PACU Anesthesia Type: General Level of consciousness: awake and alert Pain management: pain level controlled Vital Signs Assessment: post-procedure vital signs reviewed and stable Respiratory status: spontaneous breathing, nonlabored ventilation, respiratory function stable and patient connected to nasal cannula oxygen Cardiovascular status: blood pressure returned to baseline and stable Postop Assessment: no apparent nausea or vomiting Anesthetic complications: no   No notable events documented.   Last Vitals:  Vitals:   11/18/23 0842 11/18/23 0932  BP: 131/68 101/63  Pulse: 64 67  Resp: 16 16  Temp: (!) 35.9 C   SpO2: 100% 99%    Last Pain:  Vitals:   11/18/23 0932  TempSrc:   PainSc: Asleep                 Lynwood KANDICE Clause

## 2023-11-18 NOTE — H&P (Signed)
 Outpatient short stay form Pre-procedure 11/18/2023  Ole ONEIDA Schick, MD  Primary Physician: Glover Lenis, MD  Reason for visit:  Screening  History of present illness:    68 y/o lady with history of osteoporosis here for screening colonoscopy. No blood thinners. No family history of GI malignancies. No significant abdominal surgeries.    Current Facility-Administered Medications:    0.9 %  sodium chloride  infusion, , Intravenous, Continuous, Hermilo Dutter, Ole ONEIDA, MD, Last Rate: 20 mL/hr at 11/18/23 0858, 500 mL at 11/18/23 0858  Medications Prior to Admission  Medication Sig Dispense Refill Last Dose/Taking   Calcium Carbonate-Vitamin D (CALCIUM 500/D PO) Take 1 capsule by mouth 2 (two) times daily.   Past Week   CHOLECALCIFEROL PO Take 1 tablet by mouth daily.   Past Week   folic acid (FOLVITE) 1 MG tablet Take 1 mg by mouth daily.   Past Week   valACYclovir (VALTREX) 500 MG tablet Take 500 mg by mouth 2 (two) times daily.   Past Month   COVID-19 mRNA vaccine, Moderna, (MODERNA COVID-19 VACCINE ) 100 MCG/0.5ML injection Inject into the muscle. 0.25 mL 0      No Known Allergies   Past Medical History:  Diagnosis Date   Diverticulosis    Dysplastic nevus 01/24/2021   Right medial heel, moderate to severe, shave removal 03/13/20   Fibrocystic breast    Herpes genitalia    Osteopenia    Osteoporosis     Review of systems:  Otherwise negative.    Physical Exam  Gen: Alert, oriented. Appears stated age.  HEENT: PERRLA. Lungs: No respiratory distress CV: RRR Abd: soft, benign, no masses Ext: No edema    Planned procedures: Proceed with colonoscopy. The patient understands the nature of the planned procedure, indications, risks, alternatives and potential complications including but not limited to bleeding, infection, perforation, damage to internal organs and possible oversedation/side effects from anesthesia. The patient agrees and gives consent to proceed.   Please refer to procedure notes for findings, recommendations and patient disposition/instructions.     Ole ONEIDA Schick, MD [facility]

## 2023-11-18 NOTE — Interval H&P Note (Signed)
 History and Physical Interval Note:  11/18/2023 9:09 AM  Penny Monroe  has presented today for surgery, with the diagnosis of CCA SCREEN.  The various methods of treatment have been discussed with the patient and family. After consideration of risks, benefits and other options for treatment, the patient has consented to  Procedure(s): COLONOSCOPY (N/A) as a surgical intervention.  The patient's history has been reviewed, patient examined, no change in status, stable for surgery.  I have reviewed the patient's chart and labs.  Questions were answered to the patient's satisfaction.     Penny Monroe  Ok to proceed with colonoscopy

## 2023-11-19 ENCOUNTER — Other Ambulatory Visit: Payer: Self-pay | Admitting: Obstetrics and Gynecology

## 2023-11-19 DIAGNOSIS — Z01411 Encounter for gynecological examination (general) (routine) with abnormal findings: Secondary | ICD-10-CM | POA: Diagnosis not present

## 2023-11-19 DIAGNOSIS — Z1231 Encounter for screening mammogram for malignant neoplasm of breast: Secondary | ICD-10-CM

## 2023-11-19 DIAGNOSIS — Z124 Encounter for screening for malignant neoplasm of cervix: Secondary | ICD-10-CM | POA: Diagnosis not present

## 2023-11-19 DIAGNOSIS — Z1331 Encounter for screening for depression: Secondary | ICD-10-CM | POA: Diagnosis not present

## 2023-12-23 DIAGNOSIS — H25813 Combined forms of age-related cataract, bilateral: Secondary | ICD-10-CM | POA: Diagnosis not present

## 2023-12-24 DIAGNOSIS — Z1211 Encounter for screening for malignant neoplasm of colon: Secondary | ICD-10-CM | POA: Diagnosis not present

## 2024-02-11 ENCOUNTER — Ambulatory Visit
Admission: RE | Admit: 2024-02-11 | Discharge: 2024-02-11 | Disposition: A | Source: Ambulatory Visit | Attending: Obstetrics and Gynecology | Admitting: Obstetrics and Gynecology

## 2024-02-11 ENCOUNTER — Encounter: Payer: Self-pay | Admitting: Dermatology

## 2024-02-11 ENCOUNTER — Ambulatory Visit: Payer: PPO | Admitting: Dermatology

## 2024-02-11 DIAGNOSIS — L821 Other seborrheic keratosis: Secondary | ICD-10-CM

## 2024-02-11 DIAGNOSIS — L578 Other skin changes due to chronic exposure to nonionizing radiation: Secondary | ICD-10-CM | POA: Diagnosis not present

## 2024-02-11 DIAGNOSIS — D2262 Melanocytic nevi of left upper limb, including shoulder: Secondary | ICD-10-CM | POA: Diagnosis not present

## 2024-02-11 DIAGNOSIS — D1801 Hemangioma of skin and subcutaneous tissue: Secondary | ICD-10-CM

## 2024-02-11 DIAGNOSIS — D225 Melanocytic nevi of trunk: Secondary | ICD-10-CM

## 2024-02-11 DIAGNOSIS — Z1231 Encounter for screening mammogram for malignant neoplasm of breast: Secondary | ICD-10-CM | POA: Diagnosis present

## 2024-02-11 DIAGNOSIS — Z86018 Personal history of other benign neoplasm: Secondary | ICD-10-CM

## 2024-02-11 DIAGNOSIS — L918 Other hypertrophic disorders of the skin: Secondary | ICD-10-CM

## 2024-02-11 DIAGNOSIS — Z1283 Encounter for screening for malignant neoplasm of skin: Secondary | ICD-10-CM

## 2024-02-11 DIAGNOSIS — L814 Other melanin hyperpigmentation: Secondary | ICD-10-CM

## 2024-02-11 DIAGNOSIS — Q825 Congenital non-neoplastic nevus: Secondary | ICD-10-CM | POA: Diagnosis not present

## 2024-02-11 DIAGNOSIS — W908XXA Exposure to other nonionizing radiation, initial encounter: Secondary | ICD-10-CM

## 2024-02-11 DIAGNOSIS — L738 Other specified follicular disorders: Secondary | ICD-10-CM

## 2024-02-11 DIAGNOSIS — L570 Actinic keratosis: Secondary | ICD-10-CM

## 2024-02-11 DIAGNOSIS — D2239 Melanocytic nevi of other parts of face: Secondary | ICD-10-CM | POA: Diagnosis not present

## 2024-02-11 DIAGNOSIS — D229 Melanocytic nevi, unspecified: Secondary | ICD-10-CM

## 2024-02-11 NOTE — Progress Notes (Signed)
 Follow-Up Visit   Subjective  Penny Monroe is a 68 y.o. female who presents for the following: Skin Cancer Screening and Full Body Skin Exam Hx of dysplastic nevi   The patient presents for Total-Body Skin Exam (TBSE) for skin cancer screening and mole check. The patient has spots, moles and lesions to be evaluated, some may be new or changing and the patient may have concern these could be cancer. Has noticed persistent scaly spot on nose.    The following portions of the chart were reviewed this encounter and updated as appropriate: medications, allergies, medical history  Review of Systems:  No other skin or systemic complaints except as noted in HPI or Assessment and Plan.  Objective  Well appearing patient in no apparent distress; mood and affect are within normal limits.  A full examination was performed including scalp, head, eyes, ears, nose, lips, neck, chest, axillae, abdomen, back, buttocks, bilateral upper extremities, bilateral lower extremities, hands, feet, fingers, toes, fingernails, and toenails. All findings within normal limits unless otherwise noted below.   Relevant physical exam findings are noted in the Assessment and Plan.  L nasal dorsum x1 Keratotic macule   Assessment & Plan   SKIN CANCER SCREENING PERFORMED TODAY.  ACTINIC DAMAGE - Chronic condition, secondary to cumulative UV/sun exposure - diffuse scaly erythematous macules with underlying dyspigmentation - Recommend daily broad spectrum sunscreen SPF 30+ to sun-exposed areas, reapply every 2 hours as needed.  - Staying in the shade or wearing long sleeves, sun glasses (UVA+UVB protection) and wide brim hats (4-inch brim around the entire circumference of the hat) are also recommended for sun protection.  - Call for new or changing lesions.  LENTIGINES, SEBORRHEIC KERATOSES, HEMANGIOMAS - Benign normal skin lesions - Benign-appearing - Call for any changes Sk at left breast, spinal mid back,  left axilla, right spinal mid back   MELANOCYTIC NEVI - Tan-brown and/or pink-flesh-colored symmetric macules and papules - 3 mm medium brown macule at left forearm  - 2.5 mm med brown macule at left inframammary - 2 mm flesh brown speckled papule on left mid cheek   - Benign appearing on exam today - Observation - Call clinic for new or changing moles - Recommend daily use of broad spectrum spf 30+ sunscreen to sun-exposed areas.    Congenital non-neoplastic nevus  Exam:  0.8 cm light brown papule at left posterior thigh.  1.0 cm thin regular brown papule at left calf  Benign-appearing. Stable compared to previous visit. Observation.  Call clinic for new or changing moles.  Recommend daily use of broad spectrum spf 30+ sunscreen to sun-exposed areas.    Acrochordons (Skin Tags) - Fleshy, skin-colored pedunculated papules - Benign appearing.  - Observe. - If desired, they can be removed with an in office procedure that is not covered by insurance. - Please call the clinic if you notice any new or changing lesions.  Sebaceous Hyperplasia. Forehead.  - Small yellow papules with a central dell - Benign - Observe  History of Dysplastic Nevus. Right medial heel. Mod-severe. Re-shaved 03/13/2020 - No evidence of recurrence today - Recommend regular full body skin exams - Recommend daily broad spectrum sunscreen SPF 30+ to sun-exposed areas, reapply every 2 hours as needed.  - Call if any new or changing lesions are noted between office visits   Return in about 1 year (around 02/10/2025) for TBSE, HxDN.  I, Kate Fought, CMA, am acting as scribe for Rexene Rattler, MD.    Documentation:  I have reviewed the above documentation for accuracy and completeness, and I agree with the above.  Rexene Rattler, MD

## 2024-02-11 NOTE — Patient Instructions (Signed)

## 2025-03-08 ENCOUNTER — Encounter: Admitting: Dermatology
